# Patient Record
Sex: Male | Born: 1955
Health system: Southern US, Community
[De-identification: ages and names within clinical notes are randomized; demographics above are authoritative.]

## PROBLEM LIST (undated history)

## (undated) DIAGNOSIS — K5120 Ulcerative (chronic) proctitis without complications: Secondary | ICD-10-CM

## (undated) DIAGNOSIS — E785 Hyperlipidemia, unspecified: Secondary | ICD-10-CM

## (undated) HISTORY — DX: Hyperlipidemia, unspecified: E78.5

## (undated) HISTORY — PX: CATARACT EXTRACTION: SUR2

## (undated) HISTORY — DX: Ulcerative (chronic) proctitis without complications: K51.20

## (undated) HISTORY — PX: KNEE SURGERY: SHX244

---

## 2004-10-07 ENCOUNTER — Emergency Department (HOSPITAL_COMMUNITY): Admission: EM | Admit: 2004-10-07 | Discharge: 2004-10-07 | Payer: Self-pay | Admitting: Emergency Medicine

## 2005-05-23 HISTORY — PX: COLONOSCOPY: SHX174

## 2005-12-14 ENCOUNTER — Ambulatory Visit: Payer: Self-pay | Admitting: Internal Medicine

## 2005-12-14 ENCOUNTER — Ambulatory Visit (HOSPITAL_COMMUNITY): Admission: RE | Admit: 2005-12-14 | Discharge: 2005-12-14 | Payer: Self-pay | Admitting: Internal Medicine

## 2012-09-04 ENCOUNTER — Other Ambulatory Visit (INDEPENDENT_AMBULATORY_CARE_PROVIDER_SITE_OTHER): Payer: Managed Care, Other (non HMO)

## 2012-09-04 DIAGNOSIS — E785 Hyperlipidemia, unspecified: Secondary | ICD-10-CM

## 2012-09-04 DIAGNOSIS — I10 Essential (primary) hypertension: Secondary | ICD-10-CM

## 2012-09-04 LAB — COMPREHENSIVE METABOLIC PANEL
ALT: 19 U/L (ref 0–53)
AST: 19 U/L (ref 0–37)
Albumin: 4.4 g/dL (ref 3.5–5.2)
Alkaline Phosphatase: 48 U/L (ref 39–117)
BUN: 19 mg/dL (ref 6–23)
CO2: 30 mEq/L (ref 19–32)
Calcium: 9.4 mg/dL (ref 8.4–10.5)
Chloride: 105 mEq/L (ref 96–112)
Creat: 1 mg/dL (ref 0.50–1.35)
Glucose, Bld: 85 mg/dL (ref 70–99)
Potassium: 4.6 mEq/L (ref 3.5–5.3)
Sodium: 140 mEq/L (ref 135–145)
Total Bilirubin: 0.9 mg/dL (ref 0.3–1.2)
Total Protein: 6.3 g/dL (ref 6.0–8.3)

## 2012-09-04 NOTE — Progress Notes (Signed)
Patient came in for labs only.

## 2012-09-05 LAB — NMR LIPOPROFILE WITH LIPIDS
Cholesterol, Total: 167 mg/dL (ref <200)
HDL Particle Number: 32 umol/L (ref >=30.5)
HDL Size: 8.8 nm — ABNORMAL LOW (ref >=9.2)
HDL-C: 53 mg/dL (ref >=40)
LDL (calc): 105 mg/dL — ABNORMAL HIGH (ref <100)
LDL Particle Number: 1383 nmol/L — ABNORMAL HIGH (ref <1000)
LDL Size: 20.6 nm (ref >20.5)
LP-IR Score: 31 (ref <=45)
Large HDL-P: 5.5 umol/L (ref >=4.8)
Large VLDL-P: <0.8 nmol/L (ref <=2.7)
Small LDL Particle Number: 649 nmol/L — ABNORMAL HIGH (ref <=527)
Triglycerides: 46 mg/dL (ref <150)
VLDL Size: 36.8 nm (ref <=46.6)

## 2012-10-23 ENCOUNTER — Telehealth: Payer: Self-pay | Admitting: Family Medicine

## 2012-10-23 NOTE — Telephone Encounter (Signed)
Patient complains of passing bright red blood in stool x 1 week.  Mainly in the mornings.  He feels the need to have a bowel movement but it's mainly water with blood.  As the day goes on the stool is more formed.  Denies abd pain or rectal pain.  Would like an appointment and possible a GI referral.  Appt scheduled for tomorrow afternoon.

## 2012-10-24 ENCOUNTER — Encounter: Payer: Self-pay | Admitting: Nurse Practitioner

## 2012-10-24 ENCOUNTER — Ambulatory Visit (INDEPENDENT_AMBULATORY_CARE_PROVIDER_SITE_OTHER): Payer: Managed Care, Other (non HMO) | Admitting: Nurse Practitioner

## 2012-10-24 VITALS — BP 105/67 | HR 74 | Temp 98.8°F | Ht 76.0 in | Wt 204.0 lb

## 2012-10-24 DIAGNOSIS — E785 Hyperlipidemia, unspecified: Secondary | ICD-10-CM | POA: Insufficient documentation

## 2012-10-24 DIAGNOSIS — K625 Hemorrhage of anus and rectum: Secondary | ICD-10-CM

## 2012-10-24 DIAGNOSIS — N400 Enlarged prostate without lower urinary tract symptoms: Secondary | ICD-10-CM | POA: Insufficient documentation

## 2012-10-24 LAB — POCT CBC
Granulocyte percent: 53.2 %G (ref 37–80)
HCT, POC: 43 % — AB (ref 43.5–53.7)
Hemoglobin: 15.4 g/dL (ref 14.1–18.1)
Lymph, poc: 2.4 (ref 0.6–3.4)
MCH, POC: 31.6 pg — AB (ref 27–31.2)
MCHC: 35.7 g/dL — AB (ref 31.8–35.4)
MCV: 88.5 fL (ref 80–97)
MPV: 8 fL (ref 0–99.8)
POC Granulocyte: 3.5 (ref 2–6.9)
POC LYMPH PERCENT: 37.3 %L (ref 10–50)
Platelet Count, POC: 192 10*3/uL (ref 142–424)
RBC: 4.9 M/uL (ref 4.69–6.13)
RDW, POC: 12.6 %
WBC: 6.5 10*3/uL (ref 4.6–10.2)

## 2012-10-24 NOTE — Patient Instructions (Addendum)
Rectal Bleeding Rectal bleeding is when blood passes out of the anus. It is usually a sign that something is wrong. It may not be serious, but it should always be evaluated. Rectal bleeding may present as bright red blood or extremely dark stools. The color may range from dark red or maroon to black (like tar). It is important that the cause of rectal bleeding be identified so treatment can be started and the problem corrected. CAUSES   Hemorrhoids. These are enlarged (dilated) blood vessels or veins in the anal or rectal area.  Fistulas. Theseare abnormal, burrowing channels that usually run from inside the rectum to the skin around the anus. They can bleed.  Anal fissures. This is a tear in the tissue of the anus. Bleeding occurs with bowel movements.  Diverticulosis. This is a condition in which pockets or sacs project from the bowel wall. Occasionally, the sacs can bleed.  Diverticulitis. Thisis an infection involving diverticulosis of the colon.  Proctitis and colitis. These are conditions in which the rectum, colon, or both, can become inflamed and pitted (ulcerated).  Polyps and cancer. Polyps are non-cancerous (benign) growths in the colon that may bleed. Certain types of polyps turn into cancer.  Protrusion of the rectum. Part of the rectum can project from the anus and bleed.  Certain medicines.  Intestinal infections.  Blood vessel abnormalities. HOME CARE INSTRUCTIONS  Eat a high-fiber diet to keep your stool soft.  Limit activity.  Drink enough fluids to keep your urine clear or pale yellow.  Warm baths may be useful to soothe rectal pain.  Follow up with your caregiver as directed. SEEK IMMEDIATE MEDICAL CARE IF:  You develop increased bleeding.  You have black or dark red stools.  You vomit blood or material that looks like coffee grounds.  You have abdominal pain or tenderness.  You have a fever.  You feel weak, nauseous, or you faint.  You have  severe rectal pain or you are unable to have a bowel movement. MAKE SURE YOU:  Understand these instructions.  Will watch your condition.  Will get help right away if you are not doing well or get worse. Document Released: 10/29/2001 Document Revised: 08/01/2011 Document Reviewed: 10/24/2010 [facility] Patient Information 2014 [place], [place].

## 2012-10-24 NOTE — Progress Notes (Signed)
  Subjective:    Patient ID: Kyle Sexton, male    DOB: 03-17-1956, 57 y.o.   MRN: [id number]  HPI  Patient in c/o rectal bleeding- Started about 1 week ago while he was on vacation- Bright red blood- Denies an pain or discomfort with bowel movements- Denies constipation.- History of hemorrhoids. Had colonoscopy 6 years ago and had 2 polyps removed.    Review of Systems  All other systems reviewed and are negative.       Objective:   Physical Exam  Constitutional: He appears well-developed and well-nourished.  Cardiovascular: Normal rate and normal heart sounds.   Pulmonary/Chest: Effort normal and breath sounds normal.  Abdominal: Soft. Bowel sounds are normal.  Genitourinary:  Deferred rectal exam to GI    BP 105/67  Pulse 74  Temp(Src) 98.8 F (37.1 C) (Oral)  Ht 6' 4"  (1.93 m)  Wt 204 lb (92.534 kg)  BMI 24.84 kg/m2       Assessment & Plan:  1. Rectal bleeding Increase fiber in diet - Ambulatory referral to Gastroenterology

## 2012-10-29 ENCOUNTER — Telehealth: Payer: Self-pay | Admitting: Nurse Practitioner

## 2012-10-30 ENCOUNTER — Encounter: Payer: Self-pay | Admitting: Gastroenterology

## 2012-10-30 ENCOUNTER — Ambulatory Visit (INDEPENDENT_AMBULATORY_CARE_PROVIDER_SITE_OTHER): Payer: Managed Care, Other (non HMO) | Admitting: Gastroenterology

## 2012-10-30 VITALS — BP 88/53 | HR 65 | Temp 98.6°F | Ht 76.0 in | Wt 207.0 lb

## 2012-10-30 DIAGNOSIS — D369 Benign neoplasm, unspecified site: Secondary | ICD-10-CM | POA: Insufficient documentation

## 2012-10-30 DIAGNOSIS — K625 Hemorrhage of anus and rectum: Secondary | ICD-10-CM | POA: Insufficient documentation

## 2012-10-30 DIAGNOSIS — R195 Other fecal abnormalities: Secondary | ICD-10-CM

## 2012-10-30 MED ORDER — HYDROCORTISONE ACETATE 25 MG RE SUPP: 25.0000 mg | Freq: Two times a day (BID) | RECTAL | Status: DC

## 2012-10-30 MED ORDER — PEG 3350-KCL-NA BICARB-NACL 420 G PO SOLR: 4000.0000 mL | ORAL | Status: DC

## 2012-10-30 NOTE — Patient Instructions (Addendum)
Please complete the stool studies as soon as possible.  I have sent a prescription for anusol suppositories to the pharmacy to take twice a day for 6 days.   We have set you up for a colonoscopy with Dr. Gala Romney as soon as possible.

## 2012-10-30 NOTE — Assessment & Plan Note (Signed)
Stool studies.

## 2012-10-30 NOTE — Assessment & Plan Note (Signed)
Last lower GI evaluation in 2007. TCS as planned.

## 2012-10-30 NOTE — Assessment & Plan Note (Signed)
57 year old male with 3-week-history of painless hematochezia, associated with loose stools several times per day. Otherwise, no complaints and "feels great". Denies exposure to abx or sick contacts. Last colonoscopy in 2007 by Dr. Gala Romney with diminutive rectal polyp and 2X2 cm complex, carpet-like polyp, removed via piecemeal fashion, with remaining tissue ablated with APC, path noting tubular adenoma. He is somewhat overdue for surveillance at this time. Doubt dealing with infectious process, but I have ordered stat stool studies to include Cdiff, cultures, and Giardia. Encouragingly, no anemia on CBC. Needs colonoscopy as soon as possible: will plan on next week. In the interim, Anusol suppositories BID.

## 2012-10-30 NOTE — Progress Notes (Signed)
Referring Provider: Chipper Herb, MD Primary Care Physician:  Redge Gainer, MD Primary Gastroenterologist:  Dr. Gala Romney   Chief Complaint  Patient presents with  . Rectal Bleeding  . Diarrhea    HPI:   [name] is a very pleasant 57 year old male who presents today at the request of Ronnald Collum, NP, secondary to rectal bleeding. He reports 3 weeks of painless, low-volume rectal bleeding. +paper hematochezia. Denies rectal discomfort, abdominal pain, nausea, or vomiting. Denies fever or chills. Only reports occasional gas.  Had diarrhea symptoms in the winter but no bleeding. Every morning for last 3 weeks, has to go straight to bathroom. Watery with blood. An hour later normal BM. Now going 5-6 times per day. States his son has Crohn's disease and is on Humira. Overdue for surveillance colonoscopy. Last lower GI evaluation in 2007 by Dr. Gala Romney: diminutive rectal polyp and 2X2 cm complex, carpet-like polyp, removed via piecemeal fashion, with remaining tissue ablated with APC. fragments of tubular adenoma  Past Medical History  Diagnosis Date  . Hyperlipidemia     Past Surgical History  Procedure Laterality Date  . Knee surgery      both  . Colonoscopy  2007    Dr. Gala Romney: diminutive rectal polyp and 2X2 cm complex, carpet-like polyp, removed via piecemeal fashion, with remaining tissue ablated with APC. fragments of tubular adenoma    Current Outpatient Prescriptions  Medication Sig Dispense Refill  . acetaminophen (TYLENOL) 500 MG tablet Take 500 mg by mouth every 6 (six) hours as needed for pain.      Marland Kitchen ibuprofen (ADVIL,MOTRIN) 200 MG tablet Take 200 mg by mouth every 6 (six) hours as needed for pain.      . hydrocortisone (ANUSOL-HC) 25 MG suppository Place 1 suppository (25 mg total) rectally every 12 (twelve) hours.  12 suppository  1  . polyethylene glycol-electrolytes (TRILYTE) 420 G solution Take 4,000 mLs by mouth as directed.  4000 mL  0   No current  facility-administered medications for this visit.    Allergies as of 10/30/2012 - Review Complete 10/30/2012  Allergen Reaction Noted  . Bee venom Anaphylaxis 10/30/2012    Family History  Problem Relation Age of Onset  . Crohn's disease Son     History   Social History  . Marital Status: Married    Spouse Name: N/A    Number of Children: N/A  . Years of Education: N/A   Occupational History  . Not on file.   Social History Main Topics  . Smoking status: Former Research scientist (life sciences)  . Smokeless tobacco: Current User    Types: Chew  . Alcohol Use: No  . Drug Use: No  . Sexually Active: Not on file   Other Topics Concern  . Not on file   Social History Narrative  . No narrative on file    Review of Systems: Negative unless mentioned in HPI.   Physical Exam: BP 88/53  Pulse 65  Temp(Src) 98.6 F (37 C) (Oral)  Ht 6' 4"  (1.93 m)  Wt 207 lb (93.895 kg)  BMI 25.21 kg/m2 Patient states he stays hypotensive, asymptomatic.  General:   Alert and oriented. Well-developed, well-nourished, pleasant and cooperative. Head:  Normocephalic and atraumatic. Eyes:  Conjunctiva pink, sclera clear, no icterus.   Conjunctiva pink. Ears:  Normal auditory acuity. Nose:  No deformity, discharge,  or lesions. Mouth:  No deformity or lesions, mucosa pink and moist.  Neck:  Supple, without mass or thyromegaly. Lungs:  Clear to auscultation  bilaterally, without wheezing, rales, or rhonchi.  Heart:  S1, S2 present without murmurs noted.  Abdomen:  +BS, soft, non-tender and non-distended. Without mass or HSM. No rebound or guarding. No hernias noted. Rectal:  Deferred  Msk:  Symmetrical without gross deformities. Normal posture. Pulses:  Normal pulses noted. Extremities:  Without clubbing or edema. Neurologic:  Alert and  oriented x4;  grossly normal neurologically. Skin:  Intact, warm and dry without significant lesions or rashes Cervical Nodes:  No significant cervical adenopathy. Psych:   Alert and cooperative. Normal mood and affect.  Lab Results  Component Value Date   WBC 6.5 10/24/2012   HGB 15.4 10/24/2012   HCT 43.0* 10/24/2012   MCV 88.5 10/24/2012

## 2012-10-30 NOTE — Telephone Encounter (Signed)
Error

## 2012-10-31 NOTE — Progress Notes (Signed)
Cc PCP 

## 2012-11-01 ENCOUNTER — Telehealth: Payer: Self-pay | Admitting: Internal Medicine

## 2012-11-01 LAB — GIARDIA ANTIGEN: Giardia Screen (EIA): NEGATIVE

## 2012-11-01 NOTE — Telephone Encounter (Signed)
Looks like cdiff is still pending.

## 2012-11-01 NOTE — Telephone Encounter (Signed)
Pt called this afternoon asking if his stool results were back yet. I told him once they were that the nurse would be calling him. [phone]

## 2012-11-02 ENCOUNTER — Encounter (HOSPITAL_COMMUNITY): Payer: Self-pay | Admitting: Pharmacy Technician

## 2012-11-02 LAB — CLOSTRIDIUM DIFFICILE BY PCR: Toxigenic C. Difficile by PCR: NOT DETECTED

## 2012-11-04 LAB — STOOL CULTURE

## 2012-11-05 NOTE — Progress Notes (Signed)
Quick Note:  Stool studies reviewed.  Cdiff, culture, and Giardia are negative. TCS as planned. ______

## 2012-11-06 ENCOUNTER — Encounter (HOSPITAL_COMMUNITY): Payer: Self-pay | Admitting: *Deleted

## 2012-11-06 ENCOUNTER — Encounter (HOSPITAL_COMMUNITY)
Admission: RE | Disposition: A | Discharge status: Home / Self Care | Payer: Self-pay | Source: Ambulatory Visit | Attending: Internal Medicine

## 2012-11-06 ENCOUNTER — Ambulatory Visit (HOSPITAL_COMMUNITY)
Admission: RE | Admit: 2012-11-06 | Discharge: 2012-11-06 | Disposition: A | Discharge status: Home / Self Care | Payer: Managed Care, Other (non HMO) | Source: Ambulatory Visit | Attending: Internal Medicine | Admitting: Internal Medicine

## 2012-11-06 DIAGNOSIS — K5289 Other specified noninfective gastroenteritis and colitis: Secondary | ICD-10-CM | POA: Insufficient documentation

## 2012-11-06 DIAGNOSIS — K5120 Ulcerative (chronic) proctitis without complications: Secondary | ICD-10-CM

## 2012-11-06 DIAGNOSIS — R197 Diarrhea, unspecified: Secondary | ICD-10-CM | POA: Insufficient documentation

## 2012-11-06 DIAGNOSIS — K921 Melena: Secondary | ICD-10-CM

## 2012-11-06 DIAGNOSIS — Z860100 Personal history of colon polyps, unspecified: Secondary | ICD-10-CM | POA: Insufficient documentation

## 2012-11-06 DIAGNOSIS — D369 Benign neoplasm, unspecified site: Secondary | ICD-10-CM

## 2012-11-06 DIAGNOSIS — K5730 Diverticulosis of large intestine without perforation or abscess without bleeding: Secondary | ICD-10-CM | POA: Insufficient documentation

## 2012-11-06 DIAGNOSIS — E785 Hyperlipidemia, unspecified: Secondary | ICD-10-CM | POA: Insufficient documentation

## 2012-11-06 DIAGNOSIS — R195 Other fecal abnormalities: Secondary | ICD-10-CM

## 2012-11-06 DIAGNOSIS — K625 Hemorrhage of anus and rectum: Secondary | ICD-10-CM

## 2012-11-06 HISTORY — PX: COLONOSCOPY: SHX5424

## 2012-11-06 SURGERY — COLONOSCOPY
Anesthesia: Moderate Sedation

## 2012-11-06 MED ORDER — MEPERIDINE HCL 100 MG/ML IJ SOLN
INTRAMUSCULAR | Status: AC
  Filled 2012-11-06: qty 2

## 2012-11-06 MED ORDER — SODIUM CHLORIDE 0.9 % IV SOLN
INTRAVENOUS | Status: DC
  Administered 2012-11-06: 11:00:00 via INTRAVENOUS

## 2012-11-06 MED ORDER — MEPERIDINE HCL 100 MG/ML IJ SOLN
INTRAMUSCULAR | Status: DC | PRN
  Administered 2012-11-06: 25 mg via INTRAVENOUS
  Administered 2012-11-06 (×2): 50 mg via INTRAVENOUS

## 2012-11-06 MED ORDER — MIDAZOLAM HCL 5 MG/5ML IJ SOLN
INTRAMUSCULAR | Status: AC
  Filled 2012-11-06: qty 10

## 2012-11-06 MED ORDER — ONDANSETRON HCL 4 MG/2ML IJ SOLN
INTRAMUSCULAR | Status: DC | PRN
  Administered 2012-11-06: 4 mg via INTRAVENOUS

## 2012-11-06 MED ORDER — STERILE WATER FOR IRRIGATION IR SOLN
Status: DC | PRN
  Administered 2012-11-06: 12:00:00

## 2012-11-06 MED ORDER — MIDAZOLAM HCL 5 MG/5ML IJ SOLN
INTRAMUSCULAR | Status: DC | PRN
  Administered 2012-11-06: 2 mg via INTRAVENOUS
  Administered 2012-11-06 (×2): 1 mg via INTRAVENOUS
  Administered 2012-11-06: 2 mg via INTRAVENOUS

## 2012-11-06 MED ORDER — ONDANSETRON HCL 4 MG/2ML IJ SOLN
INTRAMUSCULAR | Status: AC
  Filled 2012-11-06: qty 2

## 2012-11-06 NOTE — Op Note (Signed)
[facility] [address] [place], [address]   COLONOSCOPY PROCEDURE REPORT  PATIENT: Kyle Sexton, Kyle Sexton  MR#:         [id number] BIRTHDATE: July 03, 1955 , 38  yrs. old GENDER: Male ENDOSCOPIST: R.  Garfield Cornea, MD FACP FACG REFERRED BY:  Redge Gainer, M.D. PROCEDURE DATE:  11/06/2012 PROCEDURE:     Ileo-colonoscopy with segmental biopsy  INDICATIONS: Bloody diarrhea; History of colonic adenoma  INFORMED CONSENT:  The risks, benefits, alternatives and imponderables including but not limited to bleeding, perforation as well as the possibility of a missed lesion have been reviewed.  The potential for biopsy, lesion removal, etc. have also been discussed.  Questions have been answered.  All parties agreeable. Please see the history and physical in the medical record for more information.  MEDICATIONS: Versed 6 mg IV and Demerol 125 mg IV in divided doses. Zofran 4 mg IV  DESCRIPTION OF PROCEDURE:  After a digital rectal exam was performed, the EC-[id number] ([id number])  colonoscope was advanced from the anus through the rectum and colon to the area of the cecum, ileocecal valve and appendiceal orifice.  The cecum was deeply intubated.  These structures were well-seen and photographed for the record.  From the level of the cecum and ileocecal valve, the scope was slowly and cautiously withdrawn.  The mucosal surfaces were carefully surveyed utilizing scope tip deflection to facilitate fold flattening as needed.  The scope was pulled down into the rectum where a thorough examination including retroflexion was performed.    FINDINGS:  Adequate preparation. Diffusely abnormal rectum with granularity friability and loss of the normal vascular pattern. No ulcerations. Inflammatory changes in the rectum extending confluently up to the junction of the sigmoid and descending segment; proximal to the sigmoid segment, the remainder of the colonic mucosa appeared normal except  for pancolonic diverticula; the distal 10 cm of terminal ileal mucosa appeared normal.  THERAPEUTIC / DIAGNOSTIC MANEUVERS PERFORMED:  The abnormal rectum and sigmoid were biopsied for histologic study. Likewise, biopsies of the normal-appearing descending and transverse segments were also biopsied segmentally.  COMPLICATIONS: None  CECAL WITHDRAWAL TIME:  10 minutes  IMPRESSION:  Left sided proctocolitis. Colonic diverticulosis  RECOMMENDATIONS:  I anticipate initiating therapy for ulcerative colitis in the near future pending review of pathology report. I recommend minimizing use of nonsteroidal agents. Further recommendations to follow.   _______________________________ eSigned:  R. Garfield Cornea, MD FACP [facility] 11/06/2012 1:07 PM   CC:    PATIENT NAME:  [name], [name] MR#: [id number]

## 2012-11-06 NOTE — H&P (View-Only) (Signed)
Referring Provider: Chipper Herb, MD Primary Care Physician:  Redge Gainer, MD Primary Gastroenterologist:  Dr. Gala Romney   Chief Complaint  Patient presents with  . Rectal Bleeding  . Diarrhea    HPI:   [name] is a very pleasant 57 year old male who presents today at the request of Ronnald Collum, NP, secondary to rectal bleeding. He reports 3 weeks of painless, low-volume rectal bleeding. +paper hematochezia. Denies rectal discomfort, abdominal pain, nausea, or vomiting. Denies fever or chills. Only reports occasional gas.  Had diarrhea symptoms in the winter but no bleeding. Every morning for last 3 weeks, has to go straight to bathroom. Watery with blood. An hour later normal BM. Now going 5-6 times per day. States his son has Crohn's disease and is on Humira. Overdue for surveillance colonoscopy. Last lower GI evaluation in 2007 by Dr. Gala Romney: diminutive rectal polyp and 2X2 cm complex, carpet-like polyp, removed via piecemeal fashion, with remaining tissue ablated with APC. fragments of tubular adenoma  Past Medical History  Diagnosis Date  . Hyperlipidemia     Past Surgical History  Procedure Laterality Date  . Knee surgery      both  . Colonoscopy  2007    Dr. Gala Romney: diminutive rectal polyp and 2X2 cm complex, carpet-like polyp, removed via piecemeal fashion, with remaining tissue ablated with APC. fragments of tubular adenoma    Current Outpatient Prescriptions  Medication Sig Dispense Refill  . acetaminophen (TYLENOL) 500 MG tablet Take 500 mg by mouth every 6 (six) hours as needed for pain.      Marland Kitchen ibuprofen (ADVIL,MOTRIN) 200 MG tablet Take 200 mg by mouth every 6 (six) hours as needed for pain.      . hydrocortisone (ANUSOL-HC) 25 MG suppository Place 1 suppository (25 mg total) rectally every 12 (twelve) hours.  12 suppository  1  . polyethylene glycol-electrolytes (TRILYTE) 420 G solution Take 4,000 mLs by mouth as directed.  4000 mL  0   No current  facility-administered medications for this visit.    Allergies as of 10/30/2012 - Review Complete 10/30/2012  Allergen Reaction Noted  . Bee venom Anaphylaxis 10/30/2012    Family History  Problem Relation Age of Onset  . Crohn's disease Son     History   Social History  . Marital Status: Married    Spouse Name: N/A    Number of Children: N/A  . Years of Education: N/A   Occupational History  . Not on file.   Social History Main Topics  . Smoking status: Former Research scientist (life sciences)  . Smokeless tobacco: Current User    Types: Chew  . Alcohol Use: No  . Drug Use: No  . Sexually Active: Not on file   Other Topics Concern  . Not on file   Social History Narrative  . No narrative on file    Review of Systems: Negative unless mentioned in HPI.   Physical Exam: BP 88/53  Pulse 65  Temp(Src) 98.6 F (37 C) (Oral)  Ht 6' 4"  (1.93 m)  Wt 207 lb (93.895 kg)  BMI 25.21 kg/m2 Patient states he stays hypotensive, asymptomatic.  General:   Alert and oriented. Well-developed, well-nourished, pleasant and cooperative. Head:  Normocephalic and atraumatic. Eyes:  Conjunctiva pink, sclera clear, no icterus.   Conjunctiva pink. Ears:  Normal auditory acuity. Nose:  No deformity, discharge,  or lesions. Mouth:  No deformity or lesions, mucosa pink and moist.  Neck:  Supple, without mass or thyromegaly. Lungs:  Clear to auscultation  bilaterally, without wheezing, rales, or rhonchi.  Heart:  S1, S2 present without murmurs noted.  Abdomen:  +BS, soft, non-tender and non-distended. Without mass or HSM. No rebound or guarding. No hernias noted. Rectal:  Deferred  Msk:  Symmetrical without gross deformities. Normal posture. Pulses:  Normal pulses noted. Extremities:  Without clubbing or edema. Neurologic:  Alert and  oriented x4;  grossly normal neurologically. Skin:  Intact, warm and dry without significant lesions or rashes Cervical Nodes:  No significant cervical adenopathy. Psych:   Alert and cooperative. Normal mood and affect.  Lab Results  Component Value Date   WBC 6.5 10/24/2012   HGB 15.4 10/24/2012   HCT 43.0* 10/24/2012   MCV 88.5 10/24/2012

## 2012-11-06 NOTE — Interval H&P Note (Signed)
   History and Physical Interval Note:  11/06/2012 12:12 PM  Kyle Sexton  has presented today for surgery, with the diagnosis of RECTAL BLEEDING AND ADENOMATOUS POLYPS  The various methods of treatment have been discussed with the patient and family. After consideration of risks, benefits and other options for treatment, the patient has consented to  Procedure(s) with comments: COLONOSCOPY (N/A) - 12:00 as a surgical intervention .  The patient's history has been reviewed, patient examined, no change in status, stable for surgery.  I have reviewed the patient's chart and labs.  Questions were answered to the patient's satisfaction.     Robert Rourk  Stool studies negative. Colonoscopy per plan.The risks, benefits, limitations, alternatives and imponderables have been reviewed with the patient. Questions have been answered. All parties are agreeable.

## 2012-11-07 ENCOUNTER — Telehealth: Payer: Self-pay | Admitting: Internal Medicine

## 2012-11-07 NOTE — Telephone Encounter (Signed)
Pt has reinterated exactly what he was told yesterday.  Once biopsies back (confirming UC) will likely start him on a comb of hydrocortisone enemas and colazol (cre normal 4/14). Should be wn next 48 hrs

## 2012-11-07 NOTE — Telephone Encounter (Signed)
Path report is not back yet. Routing FYI to RMR.

## 2012-11-07 NOTE — Telephone Encounter (Signed)
Pt called this afternoon to say he had his TCS done yesterday by RMR and he was told the he would know RMR's recommendations by the end of the week because patient will be working out of town next week and can not by reached. He had concerns about if RMR was going to put him on any medications before he leaves. Please advise and call [phone]

## 2012-11-08 ENCOUNTER — Encounter (HOSPITAL_COMMUNITY): Payer: Self-pay | Admitting: Internal Medicine

## 2012-11-08 MED ORDER — HYDROCORTISONE 100 MG/60ML RE ENEM: 100.0000 mg | ENEMA | Freq: Every day | RECTAL | Status: DC

## 2012-11-08 MED ORDER — BALSALAZIDE DISODIUM 750 MG PO CAPS: 2250.0000 mg | ORAL_CAPSULE | Freq: Three times a day (TID) | ORAL | Status: DC

## 2012-11-08 NOTE — Telephone Encounter (Signed)
Pt is aware of results from path and rx's per RMR have been sent to the pharmacy.

## 2012-11-19 ENCOUNTER — Encounter: Payer: Self-pay | Admitting: Internal Medicine

## 2012-12-13 ENCOUNTER — Other Ambulatory Visit: Payer: Self-pay

## 2012-12-13 ENCOUNTER — Telehealth: Payer: Self-pay | Admitting: Internal Medicine

## 2012-12-13 ENCOUNTER — Other Ambulatory Visit: Payer: Self-pay | Admitting: Internal Medicine

## 2012-12-13 DIAGNOSIS — K625 Hemorrhage of anus and rectum: Secondary | ICD-10-CM

## 2012-12-13 NOTE — Telephone Encounter (Signed)
Please call patient at [phone] regarding his medications. He has concerns and also has OV on 8/7

## 2012-12-13 NOTE — Telephone Encounter (Signed)
Patient has a concern that he has had some rectal bleeding for the last 3-4 days.  He was wondering if he should start back on the medical regiment he was on back in June 2014.  Patient also denies any other problems such as abd pain Please advise?

## 2012-12-13 NOTE — Telephone Encounter (Signed)
C-

## 2012-12-13 NOTE — Telephone Encounter (Signed)
Lab order faxed to the lab.

## 2012-12-13 NOTE — Telephone Encounter (Signed)
Patient has multiple questions. Sounds like he needs an expedited office visit with a CBC just before the visit. Please set him up for the extender

## 2012-12-13 NOTE — Telephone Encounter (Signed)
Per the patient he will have lab work done on tomorrow and he is working out of town all next week, so he will keep his 8/7 Kyle Sexton, will fax labs

## 2012-12-14 ENCOUNTER — Other Ambulatory Visit (INDEPENDENT_AMBULATORY_CARE_PROVIDER_SITE_OTHER): Payer: Managed Care, Other (non HMO)

## 2012-12-14 DIAGNOSIS — K625 Hemorrhage of anus and rectum: Secondary | ICD-10-CM

## 2012-12-14 LAB — POCT CBC
Granulocyte percent: 59.7 %G (ref 37–80)
HCT, POC: 44.8 % (ref 43.5–53.7)
Hemoglobin: 15.1 g/dL (ref 14.1–18.1)
Lymph, poc: 1.9 (ref 0.6–3.4)
MCH, POC: 30.2 pg (ref 27–31.2)
MCHC: 33.7 g/dL (ref 31.8–35.4)
MCV: 89.6 fL (ref 80–97)
MPV: 8.2 fL (ref 0–99.8)
POC Granulocyte: 3.5 (ref 2–6.9)
POC LYMPH PERCENT: 32.7 %L (ref 10–50)
Platelet Count, POC: 175 10*3/uL (ref 142–424)
RBC: 5 M/uL (ref 4.69–6.13)
RDW, POC: 12.8 %
WBC: 5.9 10*3/uL (ref 4.6–10.2)

## 2012-12-14 NOTE — Progress Notes (Signed)
Pt came in for labs from dr. Sydell Axon in notes under callls

## 2012-12-17 ENCOUNTER — Ambulatory Visit: Payer: Managed Care, Other (non HMO) | Admitting: Gastroenterology

## 2012-12-18 ENCOUNTER — Encounter: Payer: Self-pay | Admitting: Internal Medicine

## 2012-12-19 ENCOUNTER — Encounter: Payer: Self-pay | Admitting: Internal Medicine

## 2012-12-21 ENCOUNTER — Other Ambulatory Visit: Payer: Self-pay

## 2012-12-21 MED ORDER — HYDROCORTISONE 100 MG/60ML RE ENEM: 100.0000 mg | ENEMA | Freq: Every day | RECTAL | Status: DC

## 2012-12-21 NOTE — Progress Notes (Signed)
Quick Note:  Pt aware, rx sent to pharmacy. ______

## 2012-12-26 ENCOUNTER — Ambulatory Visit (INDEPENDENT_AMBULATORY_CARE_PROVIDER_SITE_OTHER): Payer: Managed Care, Other (non HMO) | Admitting: Nurse Practitioner

## 2012-12-26 DIAGNOSIS — T63461A Toxic effect of venom of wasps, accidental (unintentional), initial encounter: Secondary | ICD-10-CM

## 2012-12-26 DIAGNOSIS — T6391XA Toxic effect of contact with unspecified venomous animal, accidental (unintentional), initial encounter: Secondary | ICD-10-CM

## 2012-12-26 MED ORDER — EPINEPHRINE 0.3 MG/0.3ML IJ SOAJ: 0.3000 mg | Freq: Once | INTRAMUSCULAR | Status: DC

## 2012-12-26 MED ORDER — EPINEPHRINE HCL 1 MG/ML IJ SOLN
0.3000 mL | Freq: Once | INTRAMUSCULAR | Status: AC
  Administered 2012-12-26: 0.3 mg via INTRAMUSCULAR

## 2012-12-26 NOTE — Progress Notes (Signed)
  Subjective:    Patient ID: Kyle Sexton, male    DOB: May 28, 1955, 57 y.o.   MRN: [id number]  HPI Patient was mowing his yard and got into a yellow jackets nest and he is allergic to bee stings- Michela Pitcher he got stung 7-8 times mainly on lower ext- Feels a little short of breath and airway feels a little tight.    Review of Systems  All other systems reviewed and are negative.       Objective:   Physical Exam  Constitutional: He appears well-developed.  No acute distress  Cardiovascular: Normal rate, regular rhythm and normal heart sounds.   Pulmonary/Chest: Effort normal and breath sounds normal.  Skin:  Multiple erythematous whelps on bil lower ext.    BP 103/62  Pulse 74  Temp(Src) 97.4 F (36.3 C) (Oral)  Ht 6' 4"  (1.93 m)  Wt 212 lb (96.163 kg)  BMI 25.82 kg/m2       Assessment & Plan:  1. Bee sting reaction, initial encounter Discussed epipen injector Avoid bee stings - EPINEPHrine (ADRENALIN) injection 0.3 mg; Inject 0.3 mLs (0.3 mg total) into the muscle once. - EPINEPHrine (EPIPEN) 0.3 mg/0.3 mL SOAJ injection; Inject 0.3 mLs (0.3 mg total) into the muscle once.  Dispense: 2 Device; Refill: [place], FNP

## 2012-12-26 NOTE — Patient Instructions (Signed)
Epinephrine injection (Auto-injector) What is this medicine? EPINEPHRINE (ep i NEF rin) is used for the emergency treatment of severe allergic reactions. You should keep this medicine with you at all times. This medicine may be used for other purposes; ask your health care provider or pharmacist if you have questions. What should I tell my health care provider before I take this medicine? They need to know if you have any of the following conditions: -an unusual or allergic reaction to epinephrine, sulfites, other medicines, foods, dyes, or preservatives -pregnant or trying to get pregnant -breast-feeding How should I use this medicine? This medicine is for injection into the outer thigh. Your doctor or health care professional will instruct you on the proper use of the device during an emergency. Read all directions carefully and make sure you understand them. Do not use more often than directed. Talk to your pediatrician regarding the use of this medicine in children. Special care may be needed. This drug is commonly used in children. A special device is available for use in children. Overdosage: If you think you have taken too much of this medicine contact a poison control center or emergency room at once. NOTE: This medicine is only for you. Do not share this medicine with others. What if I miss a dose? This does not apply. You should only use this medicine for an allergic reaction. What may interact with this medicine? This medicine is only used during an emergency. Significant drug interactions are not likely during emergency use. This list may not describe all possible interactions. Give your health care provider a list of all the medicines, herbs, non-prescription drugs, or dietary supplements you use. Also tell them if you smoke, drink alcohol, or use illegal drugs. Some items may interact with your medicine. What should I watch for while using this medicine? Keep this medicine ready for  use in the case of a severe allergic reaction. Make sure that you have the phone number of your doctor or health care professional and local hospital ready. Remember to check the expiration date of your medicine regularly. You may need to have additional units of this medicine with you at work, school, or other places. Talk to your doctor or health care professional about your need for extra units. Some emergencies may require an additional dose. Check with your doctor or a health care professional before using an extra dose. After use, go to the nearest hospital or call 911. Avoid physical activity. Make sure the treating health care professional knows you have received an injection of this medicine. You will receive additional instructions on what to do during and after use of this medicine before a medical emergency occurs. What side effects may I notice from receiving this medicine? Side effects that you should report to your doctor or health care professional as soon as possible: -allergic reactions like skin rash, itching or hives, swelling of the face, lips, or tongue -breathing problems -chest pain -flushing -irregular or pounding heartbeat -numbness in fingers or toes -vomiting Side effects that usually do not require medical attention (report to your doctor or health care professional if they continue or are bothersome): -anxiety or nervousness -dizzy, drowsy -dry mouth -headache -increased sweating -nausea -tired, weak This list may not describe all possible side effects. Call your doctor for medical advice about side effects. You may report side effects to FDA at 1-800-FDA-1088. Where should I keep my medicine? Keep out of the reach of children. Store at room temperature between  15 and 30 degrees C (59 and 86 degrees F). Protect from light and heat. The solution should be clear in color. If the solution is discolored or contains particles it must be replaced. Throw away any unused  medicine after the expiration date. Ask your doctor or pharmacist about proper disposal of the injector if it is expired or has been used. Always replace your auto-injector before it expires. NOTE: This sheet is a summary. It may not cover all possible information. If you have questions about this medicine, talk to your doctor, pharmacist, or health care provider.  2013, [employer]. (09/11/2007 4:32:55 PM)

## 2012-12-27 ENCOUNTER — Encounter: Payer: Self-pay | Admitting: Gastroenterology

## 2012-12-27 ENCOUNTER — Ambulatory Visit (INDEPENDENT_AMBULATORY_CARE_PROVIDER_SITE_OTHER): Payer: Managed Care, Other (non HMO) | Admitting: Gastroenterology

## 2012-12-27 VITALS — BP 98/62 | HR 63 | Temp 97.5°F | Ht 76.0 in | Wt 212.0 lb

## 2012-12-27 DIAGNOSIS — K51211 Ulcerative (chronic) proctitis with rectal bleeding: Secondary | ICD-10-CM

## 2012-12-27 DIAGNOSIS — K5120 Ulcerative (chronic) proctitis without complications: Secondary | ICD-10-CM | POA: Insufficient documentation

## 2012-12-27 DIAGNOSIS — K5130 Ulcerative (chronic) rectosigmoiditis without complications: Secondary | ICD-10-CM

## 2012-12-27 MED ORDER — BALSALAZIDE DISODIUM 750 MG PO CAPS: 2250.0000 mg | ORAL_CAPSULE | Freq: Three times a day (TID) | ORAL | Status: DC

## 2012-12-27 NOTE — Assessment & Plan Note (Addendum)
57 year old male recently diagnosed with left-sided proctocolitis, responding well to initial therapy of Colazal 2.25 grams total daily and short course of cortenemas. However, he noted recurrence of symptoms, calling into our office, with an additional short course of cortenemas provided. On further discussion, appears he only took Colazal for one month and did not realize this was needed daily.   Discussed resuming Colazal. Prescription sent to pharmacy.  Discussed obtaining baseline DEXA scan in future; but this is not urgent.  Informed of need for flu and pneumonia vaccinations: see PCP 3 month follow-up

## 2012-12-27 NOTE — Progress Notes (Signed)
Referring Provider: Chipper Herb, MD Primary Care Physician:  Redge Gainer, MD  Chief Complaint  Patient presents with  . Follow-up    still having some rectal bleeding    HPI:   57 year old male presents today in follow-up after colonoscopy. Found to have left-sided proctocolitis and started on cortenemas 100 mg rectal nightly X 3 weeks. Colazal 750 mg tablets 3 tablets by mouth TID prescribed.   A week ago started bleeding again. Prescribed additional week of cortenemas. Mostly paper hematochezia. Has normal BM in the morning and sometimes has the urge for BM. If he does, will be water and small amount of blood. No NSAIDs. He only took one month supply of Colazal, as he thought this was all that was needed.     Past Medical History  Diagnosis Date  . Hyperlipidemia   . Ulcerative proctocolitis without complication     Past Surgical History  Procedure Laterality Date  . Knee surgery      both  . Colonoscopy  2007    Dr. Gala Romney: diminutive rectal polyp and 2X2 cm complex, carpet-like polyp, removed via piecemeal fashion, with remaining tissue ablated with APC. fragments of tubular adenoma  . Colonoscopy N/A 11/06/2012    GXQ:JJHE sided proctocolitis. Colonic diverticulosis    Current Outpatient Prescriptions  Medication Sig Dispense Refill  . acetaminophen (TYLENOL) 500 MG tablet Take 500 mg by mouth every 6 (six) hours as needed for pain.      Marland Kitchen COLOCORT 100 MG/60ML enema       . EPINEPHrine (EPIPEN) 0.3 mg/0.3 mL SOAJ injection Inject 0.3 mLs (0.3 mg total) into the muscle once.  2 Device  11  . balsalazide (COLAZAL) 750 MG capsule Take 3 capsules (2,250 mg total) by mouth 3 (three) times daily.  270 capsule  11   No current facility-administered medications for this visit.    Allergies as of 12/27/2012 - Review Complete 12/27/2012  Allergen Reaction Noted  . Bee venom Anaphylaxis 10/30/2012    Family History  Problem Relation Age of Onset  . Crohn's disease Son      History   Social History  . Marital Status: Married    Spouse Name: N/A    Number of Children: N/A  . Years of Education: N/A   Social History Main Topics  . Smoking status: Former Research scientist (life sciences)  . Smokeless tobacco: Current User    Types: Chew  . Alcohol Use: No  . Drug Use: No  . Sexually Active: None   Other Topics Concern  . None   Social History Narrative  . None    Review of Systems: Negative unless mentioned in HPI.   Physical Exam: BP 98/62  Pulse 63  Temp(Src) 97.5 F (36.4 C) (Oral)  Ht 6' 4"  (1.93 m)  Wt 212 lb (96.163 kg)  BMI 25.82 kg/m2 General:   Alert and oriented. No distress noted. Pleasant and cooperative.  Head:  Normocephalic and atraumatic. Eyes:  Conjuctiva clear without scleral icterus. Heart:  S1, S2 present without murmurs, rubs, or gallops. Regular rate and rhythm. Abdomen:  +BS, soft, non-tender and non-distended. No rebound or guarding. No HSM or masses noted. Msk:  Symmetrical without gross deformities. Normal posture. Extremities:  Without edema. Neurologic:  Alert and  oriented x4;  grossly normal neurologically. Skin:  Intact without significant lesions or rashes. Psych:  Alert and cooperative. Normal mood and affect.  Lab Results  Component Value Date   WBC 5.9 12/14/2012   HGB 15.1 12/14/2012  HCT 44.8 12/14/2012   MCV 89.6 12/14/2012

## 2012-12-27 NOTE — Patient Instructions (Addendum)
Start taking Colazal 3 capsules three times a day (total of 9 pills). You will need to continue taking this daily.   We will see you back in 3 months!  Please contact us if your symptoms persist or worsen.

## 2012-12-28 ENCOUNTER — Encounter: Payer: Self-pay | Admitting: Gastroenterology

## 2012-12-31 NOTE — Progress Notes (Signed)
CC'd to PCP 

## 2013-03-05 ENCOUNTER — Telehealth: Payer: Self-pay | Admitting: *Deleted

## 2013-03-05 NOTE — Telephone Encounter (Signed)
Per RMR- pt needs to stop the medication and have an ov this week. He is on the schedule for Thursday at 2:30. Pt is aware.

## 2013-03-05 NOTE — Telephone Encounter (Signed)
No pain just bleeding from the rectum. Started three days ago not much just when he has BM. He thinks the medication is not working now and would like to know if we could give him something different.

## 2013-03-05 NOTE — Telephone Encounter (Signed)
Pt states he is on RX ealsalazide, pt states he has been on medication for 3 months and is having bleeding. Please advise [phone].

## 2013-03-07 ENCOUNTER — Encounter (INDEPENDENT_AMBULATORY_CARE_PROVIDER_SITE_OTHER): Payer: Self-pay

## 2013-03-07 ENCOUNTER — Ambulatory Visit (INDEPENDENT_AMBULATORY_CARE_PROVIDER_SITE_OTHER): Payer: Managed Care, Other (non HMO) | Admitting: Gastroenterology

## 2013-03-07 ENCOUNTER — Encounter: Payer: Self-pay | Admitting: Gastroenterology

## 2013-03-07 VITALS — BP 98/60 | HR 74 | Temp 98.5°F | Ht 76.0 in | Wt 211.6 lb

## 2013-03-07 DIAGNOSIS — K529 Noninfective gastroenteritis and colitis, unspecified: Secondary | ICD-10-CM

## 2013-03-07 DIAGNOSIS — K5120 Ulcerative (chronic) proctitis without complications: Secondary | ICD-10-CM

## 2013-03-07 DIAGNOSIS — K625 Hemorrhage of anus and rectum: Secondary | ICD-10-CM

## 2013-03-07 MED ORDER — MESALAMINE 400 MG PO CPDR: 800.0000 mg | DELAYED_RELEASE_CAPSULE | Freq: Three times a day (TID) | ORAL | Status: DC

## 2013-03-07 NOTE — Assessment & Plan Note (Addendum)
Flare of proctocolitis with rectal bleeding. Had been doing well for couple of months up until recently. No other symptoms. He requests switching maintenance medications therefore we will try Delzicol 85m TID. Call with persistent problems. OV with Dr. RGala Romneyin six months.

## 2013-03-07 NOTE — Progress Notes (Signed)
Primary Care Physician: Redge Gainer, MD  Primary Gastroenterologist:  Garfield Cornea, MD   Chief Complaint  Patient presents with  . Rectal Bleeding    HPI: Kyle Sexton is a 57 y.o. male here for further evaluation of recurrent rectal bleeding. He was diagnosed with left-sided proctocolitis back in 10/2012. Has been on Colazal as prescribed. Denies rectal pain/abdominal pain. No diarrhea. Appetite good. Recently started having rectal bleeding again and wonders if his medication is not working like it should. Denies any recent medication changes.   Current Outpatient Prescriptions  Medication Sig Dispense Refill  . acetaminophen (TYLENOL) 500 MG tablet Take 500 mg by mouth every 6 (six) hours as needed for pain.      Marland Kitchen EPINEPHrine (EPIPEN) 0.3 mg/0.3 mL SOAJ injection Inject 0.3 mLs (0.3 mg total) into the muscle once.  2 Device  11  . balsalazide (COLAZAL) 750 MG capsule Take 3 capsules (2,250 mg total) by mouth 3 (three) times daily.  270 capsule  11  . COLOCORT 100 MG/60ML enema        No current facility-administered medications for this visit.    Allergies as of 03/07/2013 - Review Complete 03/07/2013  Allergen Reaction Noted  . Bee venom Anaphylaxis 10/30/2012    ROS:  General: Negative for anorexia, weight loss, fever, chills, fatigue, weakness. ENT: Negative for hoarseness, difficulty swallowing , nasal congestion. CV: Negative for chest pain, angina, palpitations, dyspnea on exertion, peripheral edema.  Respiratory: Negative for dyspnea at rest, dyspnea on exertion, cough, sputum, wheezing.  GI: See history of present illness. GU:  Negative for dysuria, hematuria, urinary incontinence, urinary frequency, nocturnal urination.  Endo: Negative for unusual weight change.    Physical Examination:   BP 98/60  Pulse 74  Temp(Src) 98.5 F (36.9 C) (Oral)  Ht 6' 4"  (1.93 m)  Wt 211 lb 9.6 oz (95.981 kg)  BMI 25.77 kg/m2  General: Well-nourished, well-developed in no  acute distress.  Eyes: No icterus. Mouth: Oropharyngeal mucosa moist and pink , no lesions erythema or exudate. Lungs: Clear to auscultation bilaterally.  Heart: Regular rate and rhythm, no murmurs rubs or gallops.  Abdomen: Bowel sounds are normal, nontender, nondistended, no hepatosplenomegaly or masses, no abdominal bruits or hernia , no rebound or guarding.   Extremities: No lower extremity edema. No clubbing or deformities. Neuro: Alert and oriented x 4   Skin: Warm and dry, no jaundice.   Psych: Alert and cooperative, normal mood and affect.

## 2013-03-07 NOTE — Patient Instructions (Signed)
1. Delzicol 849m three times per day. Samples provided/rebate card. If your insurance company does not cover this medication, we will try to get it approved. 2. Call if persistent bleeding.

## 2013-03-11 ENCOUNTER — Ambulatory Visit: Payer: Managed Care, Other (non HMO) | Admitting: Gastroenterology

## 2013-03-11 NOTE — Progress Notes (Signed)
cc'd to pcp 

## 2013-03-13 ENCOUNTER — Telehealth: Payer: Self-pay | Admitting: Internal Medicine

## 2013-03-13 NOTE — Telephone Encounter (Signed)
He needs a course of Rowasa enemas  one per rectum at bedtime times one month in addition to Delizol; make sure he is taking no nonsteroidals.  Please call in prescription for 30 with no refills.  Telephone Report in 2 weeks.

## 2013-03-13 NOTE — Telephone Encounter (Signed)
Pt was given Delzicol 442m on 03/07/13 and he states he is still having a lot of bleeding with bm's, no pain or discomfort. Having 1-2 bm's daily and they are normal. No N/V.  Please advise.

## 2013-03-13 NOTE — Telephone Encounter (Signed)
Pt called this afternoon. He said he has been taking the medicine we prescribe him (samples) and they aren't working for him. He said he has colitis and it's been 6 days and medicines aren't working for him. Please advise and call him ASAP as he requested at [phone]

## 2013-03-14 ENCOUNTER — Ambulatory Visit: Payer: Managed Care, Other (non HMO) | Admitting: Gastroenterology

## 2013-03-14 MED ORDER — MESALAMINE 4 G RE ENEM: 4.0000 g | ENEMA | Freq: Every day | RECTAL | Status: DC

## 2013-03-14 NOTE — Telephone Encounter (Signed)
Pt is aware. rx sent to the pharmacy.

## 2013-03-14 NOTE — Addendum Note (Signed)
Addended by: Claudina Lick on: 03/14/2013 11:14 AM   Modules accepted: Orders

## 2013-03-14 NOTE — Telephone Encounter (Signed)
Tried to call pt- LMOM 

## 2013-03-20 ENCOUNTER — Ambulatory Visit: Payer: Managed Care, Other (non HMO) | Admitting: Gastroenterology

## 2013-03-28 ENCOUNTER — Ambulatory Visit: Payer: Managed Care, Other (non HMO) | Admitting: Gastroenterology

## 2013-03-28 ENCOUNTER — Encounter: Payer: Self-pay | Admitting: Gastroenterology

## 2013-03-28 ENCOUNTER — Ambulatory Visit (INDEPENDENT_AMBULATORY_CARE_PROVIDER_SITE_OTHER): Payer: Managed Care, Other (non HMO) | Admitting: Gastroenterology

## 2013-03-28 ENCOUNTER — Telehealth: Payer: Self-pay

## 2013-03-28 VITALS — BP 104/65 | HR 83 | Temp 97.4°F | Wt 219.8 lb

## 2013-03-28 DIAGNOSIS — K529 Noninfective gastroenteritis and colitis, unspecified: Secondary | ICD-10-CM

## 2013-03-28 DIAGNOSIS — K5120 Ulcerative (chronic) proctitis without complications: Secondary | ICD-10-CM

## 2013-03-28 MED ORDER — BUDESONIDE 9 MG PO TB24: 9.0000 mg | ORAL_TABLET | Freq: Every day | ORAL | Status: DC

## 2013-03-28 NOTE — Patient Instructions (Signed)
1. Continue Delzicol. 2. Add Uceris 6m daily for 8 weeks.  3. Office visit in 8 weeks. If you are out of town, please call so we can give you taper instructions for Uceris. 4. Call within next week if you show no signs of improvement.

## 2013-03-28 NOTE — Telephone Encounter (Signed)
Pt called- he has been taking Delzicol and rowasa enemas and he stated the bleeding is getting worse. First thing in the AM he goes to the bathroom and has a BM that is a little loose and full of blood. Later in the day he will have a second BM that is normal but still has a lot of blood in it. Pt also reports a lot of gas. Pt stated he has no abd pain, no N/V, no fever.   Pt wants to know what he should do now.

## 2013-03-28 NOTE — Telephone Encounter (Signed)
Pt coming in for ov today

## 2013-03-28 NOTE — Telephone Encounter (Signed)
Will need to discuss next step in management soon

## 2013-03-28 NOTE — Progress Notes (Signed)
Primary Care Physician: Redge Gainer, MD  Primary Gastroenterologist:  Garfield Cornea, MD   Chief Complaint  Patient presents with  . Rectal Bleeding    HPI: Kyle Sexton is a 57 y.o. male here for f/u UC. Last seen 03/07/13 with complaints of hematachezia at that time. Switched from [employer] to [employer]. Called in one week later with continued complaints. Rowasa added. States he does not feel any better. Actually having worsened brbpr. C/O increased gas. Two BMs per day. No fever. No recent abx. No abdominal pain.  Current Outpatient Prescriptions  Medication Sig Dispense Refill  . acetaminophen (TYLENOL) 500 MG tablet Take 500 mg by mouth every 6 (six) hours as needed for pain.      Marland Kitchen EPINEPHrine (EPIPEN) 0.3 mg/0.3 mL SOAJ injection Inject 0.3 mLs (0.3 mg total) into the muscle once.  2 Device  11  . Mesalamine (DELZICOL) 400 MG CPDR DR capsule Take 2 capsules (800 mg total) by mouth 3 (three) times daily.  180 capsule  11  . mesalamine (ROWASA) 4 G enema Place 60 mLs (4 g total) rectally at bedtime.  30 Bottle  0   No current facility-administered medications for this visit.    Allergies as of 03/28/2013 - Review Complete 03/28/2013  Allergen Reaction Noted  . Bee venom Anaphylaxis 10/30/2012    ROS:  General: Negative for anorexia, weight loss, fever, chills, fatigue, weakness. ENT: Negative for hoarseness, difficulty swallowing , nasal congestion. CV: Negative for chest pain, angina, palpitations, dyspnea on exertion, peripheral edema.  Respiratory: Negative for dyspnea at rest, dyspnea on exertion, cough, sputum, wheezing.  GI: See history of present illness. GU:  Negative for dysuria, hematuria, urinary incontinence, urinary frequency, nocturnal urination.  Endo: Negative for unusual weight change.    Physical Examination:   BP 104/65  Pulse 83  Temp(Src) 97.4 F (36.3 C) (Oral)  Wt 219 lb 12.8 oz (99.701 kg)  General: Well-nourished, well-developed in no acute  distress.  Eyes: No icterus. Mouth: Oropharyngeal mucosa moist and pink , no lesions erythema or exudate. Lungs: Clear to auscultation bilaterally.  Heart: Regular rate and rhythm, no murmurs rubs or gallops.  Abdomen: Bowel sounds are normal, nontender, nondistended, no hepatosplenomegaly or masses, no abdominal bruits or hernia , no rebound or guarding.   Rectal: no abnormalities noted. nontender. Extremities: No lower extremity edema. No clubbing or deformities. Neuro: Alert and oriented x 4   Skin: Warm and dry, no jaundice.   Psych: Alert and cooperative, normal mood and affect.

## 2013-03-30 NOTE — Assessment & Plan Note (Signed)
Ongoing flare of proctocolitis. Continue Delzicol 800 mg TID. Add Uceris 107m daily. Continue for 8 weeks and then consider taper. Ov in 8 weeks. Call in one week if no noted improvement.

## 2013-04-01 NOTE — Progress Notes (Signed)
cc'd to pcp 

## 2013-05-28 ENCOUNTER — Ambulatory Visit: Payer: Managed Care, Other (non HMO) | Admitting: Gastroenterology

## 2013-08-19 ENCOUNTER — Telehealth: Payer: Self-pay | Admitting: Internal Medicine

## 2013-08-19 ENCOUNTER — Other Ambulatory Visit: Payer: Self-pay

## 2013-08-19 MED ORDER — BUDESONIDE 9 MG PO TB24: 9.0000 mg | ORAL_TABLET | Freq: Every day | ORAL | Status: DC

## 2013-08-19 NOTE — Telephone Encounter (Signed)
Needs OV. One refill until then.

## 2013-08-19 NOTE — Telephone Encounter (Signed)
Pt called this afternoon wanting to speak with nurse regarding a medication and if he was able to get a refill or if he needed to follow up with RMR first. I told him that the nurse would be back later today and i would have her call him back at [phone]

## 2013-08-19 NOTE — Telephone Encounter (Signed)
Patient missed his f/u in 05/2013.  He should come off of Uceris if doing well. Not intended for long-term management of his UC.  Needs f/u OV.

## 2013-08-19 NOTE — Telephone Encounter (Signed)
He doing ok but when he is off of the medication about a month the bleeding comes back, but if he starts taking the Uceris it stops. He feels states no pain and feels good. Please advise

## 2013-08-20 NOTE — Telephone Encounter (Signed)
He has a follow up appointment on April 29 @11 :00 with LSL

## 2013-08-20 NOTE — Telephone Encounter (Signed)
Ginger spoke with pt. See refill request.

## 2013-08-28 ENCOUNTER — Ambulatory Visit (INDEPENDENT_AMBULATORY_CARE_PROVIDER_SITE_OTHER): Payer: Managed Care, Other (non HMO) | Admitting: *Deleted

## 2013-08-28 DIAGNOSIS — Z23 Encounter for immunization: Secondary | ICD-10-CM

## 2013-08-28 NOTE — Patient Instructions (Signed)
Tetanus, Diphtheria, Pertussis (Tdap) Vaccine What You Need to Know WHY GET VACCINATED? Tetanus, diphtheria and pertussis can be very serious diseases, even for adolescents and adults. Tdap vaccine can protect [place] from these diseases. TETANUS (Lockjaw) causes painful muscle tightening and stiffness, usually all over the body.  It can lead to tightening of muscles in the head and neck so you can't open your mouth, swallow, or sometimes even breathe. Tetanus kills about 1 out of 5 people who are infected. DIPHTHERIA can cause a thick coating to form in the back of the throat.  It can lead to breathing problems, paralysis, heart failure, and death. PERTUSSIS (Whooping Cough) causes severe coughing spells, which can cause difficulty breathing, vomiting and disturbed sleep.  It can also lead to weight loss, incontinence, and rib fractures. Up to 2 in 100 adolescents and 5 in 100 adults with pertussis are hospitalized or have complications, which could include pneumonia and death. These diseases are caused by bacteria. Diphtheria and pertussis are spread from person to person through coughing or sneezing. Tetanus enters the body through cuts, scratches, or wounds. Before vaccines, the [place] saw as many as 200,000 cases a year of diphtheria and pertussis, and hundreds of cases of tetanus. Since vaccination began, tetanus and diphtheria have dropped by about 99% and pertussis by about 80%. TDAP VACCINE Tdap vaccine can protect adolescents and adults from tetanus, diphtheria, and pertussis. One dose of Tdap is routinely given at age 46 or 65. People who did not get Tdap at that age should get it as soon as possible. Tdap is especially important for health care professionals and anyone having close contact with a baby younger than 12 months. Pregnant women should get a dose of Tdap during every pregnancy, to protect the newborn from pertussis. Infants are most at risk for severe, life-threatening  complications from pertussis. A similar vaccine, called Td, protects from tetanus and diphtheria, but not pertussis. A Td booster should be given every 10 years. Tdap may be given as one of these boosters if you have not already gotten a dose. Tdap may also be given after a severe cut or burn to prevent tetanus infection. Your doctor can give you more information. Tdap may safely be given at the same time as other vaccines. SOME PEOPLE SHOULD NOT GET THIS VACCINE  If you ever had a life-threatening allergic reaction after a dose of any tetanus, diphtheria, or pertussis containing vaccine, OR if you have a severe allergy to any part of this vaccine, you should not get Tdap. Tell your doctor if you have any severe allergies.  If you had a coma, or long or multiple seizures within 7 days after a childhood dose of DTP or DTaP, you should not get Tdap, unless a cause other than the vaccine was found. You can still get Td.  Talk to your doctor if you:  have epilepsy or another nervous system problem,  had severe pain or swelling after any vaccine containing diphtheria, tetanus or pertussis,  ever had Guillain-Barr Syndrome (GBS),  aren't feeling well on the day the shot is scheduled. RISKS OF A VACCINE REACTION With any medicine, including vaccines, there is a chance of side effects. These are usually mild and go away on their own, but serious reactions are also possible. Brief fainting spells can follow a vaccination, leading to injuries from falling. Sitting or lying down for about 15 minutes can help prevent these. Tell your doctor if you feel dizzy or light-headed, or  have vision changes or ringing in the ears. Mild problems following Tdap (Did not interfere with activities)  Pain where the shot was given (about 3 in 4 adolescents or 2 in 3 adults)  Redness or swelling where the shot was given (about 1 person in 5)  Mild fever of at least 100.40F (up to about 1 in 25 adolescents or 1 in  100 adults)  Headache (about 3 or 4 people in 10)  Tiredness (about 1 person in 3 or 4)  Nausea, vomiting, diarrhea, stomach ache (up to 1 in 4 adolescents or 1 in 10 adults)  Chills, body aches, sore joints, rash, swollen glands (uncommon) Moderate problems following Tdap (Interfered with activities, but did not require medical attention)  Pain where the shot was given (about 1 in 5 adolescents or 1 in 100 adults)  Redness or swelling where the shot was given (up to about 1 in 16 adolescents or 1 in 25 adults)  Fever over 102F (about 1 in 100 adolescents or 1 in 250 adults)  Headache (about 3 in 20 adolescents or 1 in 10 adults)  Nausea, vomiting, diarrhea, stomach ache (up to 1 or 3 people in 100)  Swelling of the entire arm where the shot was given (up to about 3 in 100). Severe problems following Tdap (Unable to perform usual activities, required medical attention)  Swelling, severe pain, bleeding and redness in the arm where the shot was given (rare). A severe allergic reaction could occur after any vaccine (estimated less than 1 in a million doses). WHAT IF THERE IS A SERIOUS REACTION? What should I look for?  Look for anything that concerns you, such as signs of a severe allergic reaction, very high fever, or behavior changes. Signs of a severe allergic reaction can include hives, swelling of the face and throat, difficulty breathing, a fast heartbeat, dizziness, and weakness. These would start a few minutes to a few hours after the vaccination. What should I do?  If you think it is a severe allergic reaction or other emergency that can't wait, call 9-1-1 or get the person to the nearest hospital. Otherwise, call your doctor.  Afterward, the reaction should be reported to the "Vaccine Adverse Event Reporting System" (VAERS). Your doctor might file this report, or you can do it yourself through the VAERS web site at www.vaers.SamedayNews.es, or by calling (252) 628-6441. VAERS is  only for reporting reactions. They do not give medical advice.  THE NATIONAL VACCINE INJURY COMPENSATION PROGRAM The National Vaccine Injury Compensation Program (VICP) is a federal program that was created to compensate people who may have been injured by certain vaccines. Persons who believe they may have been injured by a vaccine can learn about the program and about filing a claim by calling 929-505-6023 or visiting the [place] website at GoldCloset.com.ee. HOW CAN I LEARN MORE?  Ask your doctor.  Call your local or state health department.  Contact the Centers for Disease Control and Prevention (CDC):  Call 762-325-6008 or visit CDC's website at http://hunter.com/. CDC Tdap Vaccine VIS (09/29/11) Document Released: 11/08/2011 Document Revised: 09/03/2012 Document Reviewed: 08/29/2012 [facility] Patient Information 2014 [place], [place].

## 2013-08-28 NOTE — Progress Notes (Signed)
Patient here for Tdap. Patient given and tolerated well.

## 2013-09-18 ENCOUNTER — Ambulatory Visit (INDEPENDENT_AMBULATORY_CARE_PROVIDER_SITE_OTHER): Payer: Managed Care, Other (non HMO) | Admitting: Gastroenterology

## 2013-09-18 ENCOUNTER — Encounter: Payer: Self-pay | Admitting: Gastroenterology

## 2013-09-18 VITALS — BP 120/77 | HR 63 | Temp 97.6°F | Ht 74.0 in | Wt 215.4 lb

## 2013-09-18 DIAGNOSIS — K51211 Ulcerative (chronic) proctitis with rectal bleeding: Secondary | ICD-10-CM

## 2013-09-18 DIAGNOSIS — K5120 Ulcerative (chronic) proctitis without complications: Secondary | ICD-10-CM

## 2013-09-18 NOTE — Patient Instructions (Addendum)
1. Finish up [employer]. 2. Continue Delzicol. 3. Start Rowasa (mesalamine) enemas, one at bedtime three times per week. 4. Call if you have recurrent rectal bleeding.  5. Office visit in four months with Dr. Gala Romney.  6. You will need bone density study some time in the next six months. Let me know when you are ready.  Delzicol can cause muscle aches/cramps. Let me know if symptoms are more frequent.

## 2013-09-18 NOTE — Progress Notes (Signed)
      Primary Care Physician: Redge Gainer, MD  Primary Gastroenterologist:  Garfield Cornea, MD   Chief Complaint  Patient presents with  . Follow-up    HPI: Kyle Sexton is a 58 y.o. male here for followup of UC. He has left-sided disease documented at time of colonoscopy in June 2014. He was initiated on mesalamine therapy (initially Colazal and then Delzicol) and subsequently Uceris added. Has had Rowasa enemas as well but patient tells me he only took a few in the past. He was last seen in November. It was at that time we added Uceris 74m daily due to rectal bleeding.   Patient states he does fairly well until he comes off of Uceris. Then he has recurrent rectal bleeding. He has been back on the medication for 4 weeks. Denies any further rectal bleeding. Has not had any recent tenesmus or diarrhea like he had prior to his diagnosis. Denies abdominal pain. Feels great.    Current Outpatient Prescriptions  Medication Sig Dispense Refill  . acetaminophen (TYLENOL) 500 MG tablet Take 500 mg by mouth every 6 (six) hours as needed for pain.      . Budesonide (UCERIS) 9 MG TB24 Take 9 mg by mouth daily.  30 tablet  0  . EPINEPHrine (EPIPEN) 0.3 mg/0.3 mL SOAJ injection Inject 0.3 mLs (0.3 mg total) into the muscle once.  2 Device  11  . Mesalamine (DELZICOL) 400 MG CPDR DR capsule Take 2 capsules (800 mg total) by mouth 3 (three) times daily.  180 capsule  11   No current facility-administered medications for this visit.    Allergies as of 09/18/2013 - Review Complete 09/18/2013  Allergen Reaction Noted  . Bee venom Anaphylaxis 10/30/2012    ROS:  General: Negative for anorexia, weight loss, fever, chills, fatigue, weakness. ENT: Negative for hoarseness, difficulty swallowing , nasal congestion. CV: Negative for chest pain, angina, palpitations, dyspnea on exertion, peripheral edema.  Respiratory: Negative for dyspnea at rest, dyspnea on exertion, cough, sputum, wheezing.  GI:  See history of present illness. GU:  Negative for dysuria, hematuria, urinary incontinence, urinary frequency, nocturnal urination.  Endo: Negative for unusual weight change.    Physical Examination:   BP 120/77  Pulse 63  Temp(Src) 97.6 F (36.4 C) (Oral)  Ht 6' 2"  (1.88 m)  Wt 215 lb 6.4 oz (97.705 kg)  BMI 27.64 kg/m2  General: Well-nourished, well-developed in no acute distress.  Eyes: No icterus. Mouth: Oropharyngeal mucosa moist and pink , no lesions erythema or exudate. Lungs: Clear to auscultation bilaterally.  Heart: Regular rate and rhythm, no murmurs rubs or gallops.  Abdomen: Bowel sounds are normal, nontender, nondistended, no hepatosplenomegaly or masses, no abdominal bruits or hernia , no rebound or guarding.   Extremities: No lower extremity edema. No clubbing or deformities. Neuro: Alert and oriented x 4   Skin: Warm and dry, no jaundice.   Psych: Alert and cooperative, normal mood and affect.

## 2013-09-18 NOTE — Progress Notes (Signed)
CC'd to pcp

## 2013-09-18 NOTE — Assessment & Plan Note (Signed)
Left-sided proctocolitis, went into remission on Uceris but with stopping medication he began having recurrent rectal bleeding. Discussed need to avoid long-term therapy with Uceris. Continue Delzicol at 834m TID. We will add Rowasa enemas TIW. Hopefully he will maintain remission when stopping uceris this time. He will call with recurrent bleeding. If continues to have problems, consider flex sig for reevaluation. OV in four months with Dr. RGala Romneyonly.

## 2013-10-04 ENCOUNTER — Encounter: Payer: Self-pay | Admitting: Nurse Practitioner

## 2013-10-04 ENCOUNTER — Ambulatory Visit (INDEPENDENT_AMBULATORY_CARE_PROVIDER_SITE_OTHER): Payer: Managed Care, Other (non HMO) | Admitting: Nurse Practitioner

## 2013-10-04 VITALS — BP 108/65 | HR 62 | Temp 97.6°F | Ht 74.0 in | Wt 217.0 lb

## 2013-10-04 DIAGNOSIS — E785 Hyperlipidemia, unspecified: Secondary | ICD-10-CM

## 2013-10-04 DIAGNOSIS — N400 Enlarged prostate without lower urinary tract symptoms: Secondary | ICD-10-CM

## 2013-10-04 DIAGNOSIS — Z0000 Encounter for general adult medical examination without abnormal findings: Secondary | ICD-10-CM

## 2013-10-04 NOTE — Progress Notes (Signed)
   Subjective:    Patient ID: Matteo Banke Wirsing, male    DOB: [birth year], 58 y.o.   MRN: [id number]   Patient here today for annual physical exam- he is doing well- no complaints today.  Hyperlipidemia This is a chronic problem. The current episode started more than 1 year ago. The problem is uncontrolled. Recent lipid tests were reviewed and are high. He has no history of diabetes, hypothyroidism or obesity. Current antihyperlipidemic treatment includes diet change. There are no compliance problems.  Risk factors for coronary artery disease include dyslipidemia, family history and male sex.  colitis No bleeding- currently under control with meds.  Review of Systems  Constitutional: Negative.   HENT: Negative.   Respiratory: Negative.   Cardiovascular: Negative.   Gastrointestinal: Negative.   Genitourinary: Negative.   Neurological: Negative.   Hematological: Negative.   Psychiatric/Behavioral: Negative.   All other systems reviewed and are negative.      Objective:   Physical Exam  Constitutional: He is oriented to person, place, and time. He appears well-developed and well-nourished.  HENT:  Head: Normocephalic.  Right Ear: External ear normal.  Left Ear: External ear normal.  Nose: Nose normal.  Mouth/Throat: Oropharynx is clear and moist.  Eyes: EOM are normal. Pupils are equal, round, and reactive to light.  Neck: Normal range of motion. Neck supple. No JVD present. No thyromegaly present.  Cardiovascular: Normal rate, regular rhythm, normal heart sounds and intact distal pulses.  Exam reveals no gallop and no friction rub.   No murmur heard. Large varicose veins bil lower ext.  Pulmonary/Chest: Effort normal and breath sounds normal. No respiratory distress. He has no wheezes. He has no rales. He exhibits no tenderness.  Abdominal: Soft. Bowel sounds are normal. He exhibits no mass. There is no tenderness.  Genitourinary:  Rectal exam referred to urologist    Musculoskeletal: Normal range of motion. He exhibits no edema.  Lymphadenopathy:    He has no cervical adenopathy.  Neurological: He is alert and oriented to person, place, and time. No cranial nerve deficit.  Skin: Skin is warm and dry.  Psychiatric: He has a normal mood and affect. His behavior is normal. Judgment and thought content normal.    BP 108/65  Pulse 62  Temp(Src) 97.6 F (36.4 C) (Oral)  Ht _0  (1.88 m)  Wt 217 lb (98.431 kg)  BMI 27.85 kg/m2       Assessment & Plan:   1. Annual physical exam   2. Hyperlipidemia   3. BPH (benign prostatic hyperplasia)    Orders Placed This Encounter  Procedures  . CMP14+EGFR  . NMR, lipoprofile  . PSA, total and free   Follow up with urologist for prostate check Continue all meds Labs pending Diet and exercise encouraged   Mary-Margaret Hassell Done, FNP

## 2013-10-05 LAB — CMP14+EGFR
ALT: 24 IU/L (ref 0–44)
AST: 21 IU/L (ref 0–40)
Albumin/Globulin Ratio: 2.3 (ref 1.1–2.5)
Albumin: 4.2 g/dL (ref 3.5–5.5)
Alkaline Phosphatase: 52 IU/L (ref 39–117)
BUN/Creatinine Ratio: 17 (ref 9–20)
BUN: 16 mg/dL (ref 6–24)
CO2: 27 mmol/L (ref 18–29)
Calcium: 9.4 mg/dL (ref 8.7–10.2)
Chloride: 101 mmol/L (ref 97–108)
Creatinine, Ser: 0.92 mg/dL (ref 0.76–1.27)
GFR calc Af Amer: 106 mL/min/{1.73_m2} (ref >59)
GFR calc non Af Amer: 91 mL/min/{1.73_m2} (ref >59)
Globulin, Total: 1.8 g/dL (ref 1.5–4.5)
Glucose: 83 mg/dL (ref 65–99)
Potassium: 4.6 mmol/L (ref 3.5–5.2)
Sodium: 142 mmol/L (ref 134–144)
Total Bilirubin: 0.5 mg/dL (ref 0.0–1.2)
Total Protein: 6 g/dL (ref 6.0–8.5)

## 2013-10-05 LAB — NMR, LIPOPROFILE
Cholesterol: 175 mg/dL (ref 100–199)
HDL Cholesterol by NMR: 54 mg/dL (ref >39)
HDL Particle Number: 38.4 umol/L (ref >=30.5)
LDL Particle Number: 1526 nmol/L — ABNORMAL HIGH (ref <1000)
LDL Size: 20.3 nm (ref >20.5)
LDLC SERPL CALC-MCNC: 109 mg/dL — ABNORMAL HIGH (ref 0–99)
LP-IR Score: 36 (ref <=45)
Small LDL Particle Number: 856 nmol/L — ABNORMAL HIGH (ref <=527)
Triglycerides by NMR: 59 mg/dL (ref 0–149)

## 2013-10-05 LAB — PSA, TOTAL AND FREE
PSA, Free Pct: 33.6 %
PSA, Free: 0.37 ng/mL
PSA: 1.1 ng/mL (ref 0.0–4.0)

## 2013-10-05 LAB — SPECIMEN STATUS REPORT

## 2013-10-21 ENCOUNTER — Ambulatory Visit (INDEPENDENT_AMBULATORY_CARE_PROVIDER_SITE_OTHER): Payer: Managed Care, Other (non HMO) | Admitting: Family Medicine

## 2013-10-21 VITALS — BP 111/72 | HR 74 | Temp 97.6°F | Ht 74.0 in | Wt 219.0 lb

## 2013-10-21 DIAGNOSIS — T7840XA Allergy, unspecified, initial encounter: Secondary | ICD-10-CM

## 2013-10-21 MED ORDER — METHYLPREDNISOLONE ACETATE 80 MG/ML IJ SUSP
80.0000 mg | Freq: Once | INTRAMUSCULAR | Status: AC
  Administered 2013-10-21: 80 mg via INTRAMUSCULAR

## 2013-10-21 MED ORDER — EPINEPHRINE HCL 1 MG/ML IJ SOLN
0.3000 mL | Freq: Once | INTRAMUSCULAR | Status: AC
  Administered 2013-10-21: 0.3 mg via SUBCUTANEOUS

## 2013-10-21 NOTE — Progress Notes (Signed)
   Subjective:    Patient ID: Kyle Sexton, male    DOB: [birth year], 58 y.o.   MRN: [id number]  HPI  Patient comes into clinic emergently with c/o bee sting on his tongue and c/o swelling and  Having a rxn to the bee sting. He left his epi-pen at home.  He has taken a benadryl.  He states he has this at least once a year.  Review of Systems C/o tongue swelling and bee sting No chest pain, SOB, HA, dizziness, vision change, N/V, diarrhea, constipation, dysuria, urinary urgency or frequency, myalgias, arthralgias or rash.     Objective:   Physical Exam  Vital signs noted  Well developed well nourished male.  HEENT - Head atraumatic Normocephalic                Eyes - PERRLA, Conjuctiva - clear Sclera- Clear EOMI                Ears - EAC's Wnl TM's Wnl Gross Hearing                Throat - oropharanx wnl, erythematous region on the tip of his tongue which does not appear                               Swollen. Respiratory - Lungs with wheeze right chest and CTA left chest Cardiac - RRR S1 and S2 without murmur GI - Abdomen soft Nontender and bowel sounds active x 4 Extremities - No edema. Neuro - Grossly intact.      Assessment & Plan:  Allergic reaction - Plan: EPINEPHrine (ADRENALIN) injection 0.3 mg, methylPREDNISolone acetate (DEPO-MEDROL) injection 80 mg  Bee sting - Epi 0.68m sq and depomedrol 819mIM and take benadryl otc and carry epi pen with you And follow up prn.  Offered medrol dose pack and wants to wait. Follow up prn or call 911 if any recurrence.  After 30 minutes after shots patient does not have any sx's and states he feels great and is ready to leave.  WiLysbeth PennerNP

## 2013-10-21 NOTE — Patient Instructions (Signed)
Bee, Wasp, or Hornet Sting Your caregiver has diagnosed you as having an insect sting. An insect sting appears as a red lump in the skin that sometimes has a tiny hole in the center, or it may have a stinger in the center of the wound. The most common stings are from wasps, hornets and bees. Individuals have different reactions to insect stings.  A normal reaction may cause pain, swelling, and redness around the sting site.  A localized allergic reaction may cause swelling and redness that extends beyond the sting site.  A large local reaction may continue to develop over the next 12 to 36 hours.  On occasion, the reactions can be severe (anaphylactic reaction). An anaphylactic reaction may cause wheezing; difficulty breathing; chest pain; fainting; raised, itchy, red patches on the skin; a sick feeling to your stomach (nausea); vomiting; cramping; or diarrhea. If you have had an anaphylactic reaction to an insect sting in the past, you are more likely to have one again. HOME CARE INSTRUCTIONS   With bee stings, a small sac of poison is left in the wound. Brushing across this with something such as a credit card, or anything similar, will help remove this and decrease the amount of the reaction. This same procedure will not help a wasp sting as they do not leave behind a stinger and poison sac.  Apply a cold compress for 10 to 20 minutes every hour for 1 to 2 days, depending on severity, to reduce swelling and itching.  To lessen pain, a paste made of water and baking soda may be rubbed on the bite or sting and left on for 5 minutes.  To relieve itching and swelling, you may use take medication or apply medicated creams or lotions as directed.  Only take over-the-counter or prescription medicines for pain, discomfort, or fever as directed by your caregiver.  Wash the sting site daily with soap and water. Apply antibiotic ointment on the sting site as directed.  If you suffered a severe  reaction:  If you did not require hospitalization, an adult will need to stay with you for 24 hours in case the symptoms return.  You may need to wear a medical bracelet or necklace stating the allergy.  You and your family need to learn when and how to use an anaphylaxis kit or epinephrine injection.  If you have had a severe reaction before, always carry your anaphylaxis kit with you. SEEK MEDICAL CARE IF:   None of the above helps within 2 to 3 days.  The area becomes red, warm, tender, and swollen beyond the area of the bite or sting.  You have an oral temperature above 102 F (38.9 C). SEEK IMMEDIATE MEDICAL CARE IF:  You have symptoms of an allergic reaction which are:  Wheezing.  Difficulty breathing.  Chest pain.  Lightheadedness or fainting.  Itchy, raised, red patches on the skin.  Nausea, vomiting, cramping or diarrhea. ANY OF THESE SYMPTOMS MAY REPRESENT A SERIOUS PROBLEM THAT IS AN EMERGENCY. Do not wait to see if the symptoms will go away. Get medical help right away. Call your local emergency services (911 in [place] DO NOT drive yourself to the hospital. MAKE SURE YOU:   Understand these instructions.  Will watch your condition.  Will get help right away if you are not doing well or get worse. Document Released: 05/09/2005 Document Revised: 08/01/2011 Document Reviewed: 10/24/2009 [employer] Patient Information 2014 [employer].  

## 2013-11-04 ENCOUNTER — Telehealth: Payer: Self-pay | Admitting: *Deleted

## 2013-11-04 NOTE — Telephone Encounter (Signed)
Pt called wanting to speak with Magda Paganini pt said she changed his medication and he is doing really good, pt stated Magda Paganini wants to reduce his dosage and pt wants to speak with Magda Paganini about it. Please advise [phone]

## 2013-11-06 NOTE — Telephone Encounter (Signed)
Feels well. Not using Rowasa anymore. He dropped back to 878m BID on Delzicol and is doing well. This is standard maintenance dose so recommend he continue. OV in 12/2013 or 01/2014 with RMR as previously noted.

## 2013-11-06 NOTE — Telephone Encounter (Signed)
Pt called and said he left message for Magda Paganini to call him on Mon and he has not heard from her.  I explained to him that Leigh leaves earlier on Mon and Tues.  I will give her the message.  He said he is doing fine, he just needs to discuss the medications.

## 2013-11-08 NOTE — Telephone Encounter (Signed)
Routing to [place] to [employer].

## 2013-11-11 NOTE — Telephone Encounter (Signed)
Reminder in epic to follow up in August 2015 with RMR

## 2014-03-24 NOTE — Telephone Encounter (Signed)
Open in error

## 2014-03-31 ENCOUNTER — Telehealth: Payer: Self-pay | Admitting: *Deleted

## 2014-03-31 NOTE — Telephone Encounter (Signed)
Patient called stating that [facility] in [address] has not received refill request   Please call [phone]

## 2014-04-01 ENCOUNTER — Other Ambulatory Visit: Payer: Self-pay

## 2014-04-01 ENCOUNTER — Encounter: Payer: Self-pay | Admitting: Internal Medicine

## 2014-04-01 MED ORDER — MESALAMINE 400 MG PO CPDR: 800.0000 mg | DELAYED_RELEASE_CAPSULE | Freq: Three times a day (TID) | ORAL | Status: DC

## 2014-04-01 NOTE — Telephone Encounter (Signed)
done

## 2014-04-01 NOTE — Telephone Encounter (Signed)
Routing to the refill box. 

## 2014-04-01 NOTE — Telephone Encounter (Signed)
RX done. Needs nonurgent OV with RMR only.

## 2014-04-01 NOTE — Telephone Encounter (Signed)
APPT MADE AND LETTER SENT  °

## 2014-05-13 ENCOUNTER — Ambulatory Visit: Payer: Managed Care, Other (non HMO) | Admitting: Internal Medicine

## 2014-06-10 ENCOUNTER — Ambulatory Visit: Payer: Managed Care, Other (non HMO) | Admitting: Internal Medicine

## 2014-06-13 ENCOUNTER — Ambulatory Visit: Payer: Managed Care, Other (non HMO) | Admitting: Internal Medicine

## 2014-07-25 ENCOUNTER — Encounter: Payer: Self-pay | Admitting: Internal Medicine

## 2014-07-25 ENCOUNTER — Ambulatory Visit (INDEPENDENT_AMBULATORY_CARE_PROVIDER_SITE_OTHER): Payer: Managed Care, Other (non HMO) | Admitting: Internal Medicine

## 2014-07-25 VITALS — BP 112/62 | HR 69 | Temp 97.4°F | Ht 76.0 in | Wt 218.0 lb

## 2014-07-25 DIAGNOSIS — K5120 Ulcerative (chronic) proctitis without complications: Secondary | ICD-10-CM

## 2014-07-25 NOTE — Patient Instructions (Signed)
Continue Delzicol 800 mg twice daily   Avoid NSAID agents as much as possible   Office visit in 1 year with me

## 2014-07-25 NOTE — Progress Notes (Signed)
Primary Care Physician:  Chevis Pretty, FNP Primary Gastroenterologist:  Dr. Gala Romney  Pre-Procedure History & Physical: HPI:  Kyle Sexton is a 59 y.o. male here for follow up for left sided proctocolitis. Delzicol 800 mg twice a day. Patient reports doing great. Normal bowel function. No bleeding. No tenesmus. No diarrhea. No abdominal pain He feels well. He has asked about dropping the dose even further. No nonsteroidal agents recently. History of colonic adenoma removed 2007.; Due for surveillance colonoscopy 2019.  Serum creatinine normal 9 months ago.  Past Medical History  Diagnosis Date  . Hyperlipidemia   . Ulcerative proctocolitis without complication     Past Surgical History  Procedure Laterality Date  . Knee surgery      both  . Colonoscopy  2007    Dr. Gala Romney: diminutive rectal polyp and 2X2 cm complex, carpet-like polyp, removed via piecemeal fashion, with remaining tissue ablated with APC. fragments of tubular adenoma  . Colonoscopy N/A 11/06/2012    UXN:ATFT sided proctocolitis. Colonic diverticulosis    Prior to Admission medications   Medication Sig Start Date End Date Taking? Authorizing Provider  acetaminophen (TYLENOL) 500 MG tablet Take 500 mg by mouth every 6 (six) hours as needed for pain.   Yes Historical Provider, MD  Budesonide (UCERIS) 9 MG TB24 Take 9 mg by mouth daily. 08/19/13  Yes Mahala Menghini, PA-C  EPINEPHrine (EPIPEN) 0.3 mg/0.3 mL SOAJ injection Inject 0.3 mLs (0.3 mg total) into the muscle once. 12/26/12  Yes Mary-Margaret Hassell Done, FNP  Mesalamine (DELZICOL) 400 MG CPDR DR capsule Take 2 capsules (800 mg total) by mouth 3 (three) times daily. Patient taking differently: Take 800 mg by mouth 2 (two) times daily.  04/01/14  Yes Mahala Menghini, PA-C  mesalamine (ROWASA) 4 G enema One enema three times weekly. 09/18/13   Mahala Menghini, PA-C    Allergies as of 07/25/2014 - Review Complete 07/25/2014  Allergen Reaction Noted  . Bee venom  Anaphylaxis 10/30/2012    Family History  Problem Relation Age of Onset  . Crohn's disease Son     History   Social History  . Marital Status: Married    Spouse Name: N/A  . Number of Children: N/A  . Years of Education: N/A   Occupational History  . Not on file.   Social History Main Topics  . Smoking status: Former Research scientist (life sciences)  . Smokeless tobacco: Current User    Types: Chew  . Alcohol Use: No  . Drug Use: No  . Sexual Activity: Not on file   Other Topics Concern  . Not on file   Social History Narrative    Review of Systems: See HPI, otherwise negative ROS  Physical Exam: BP 112/62 mmHg  Pulse 69  Temp(Src) 97.4 F (36.3 C)  Ht 6' 4"  (1.93 m)  Wt 218 lb (98.884 kg)  BMI 26.55 kg/m2 General:   Alert,  Well-developed, well-nourished, pleasant and cooperative in NAD Skin:  Intact without significant lesions or rashes. Eyes:  Sclera clear, no icterus.   Conjunctiva pink. Ears:  Normal auditory acuity. Nose:  No deformity, discharge,  or lesions. Mouth:  No deformity or lesions. Neck:  Supple; no masses or thyromegaly. No significant cervical adenopathy. Lungs:  Clear throughout to auscultation.   No wheezes, crackles, or rhonchi. No acute distress. Heart:  Regular rate and rhythm; no murmurs, clicks, rubs,  or gallops. Abdomen: Non-distended, normal bowel sounds.  Soft and nontender without appreciable mass or hepatosplenomegaly.  Pulses:  Normal pulses noted. Extremities:  Without clubbing or edema.  Impression:  Left sided proctocolitis in remission. History of colonic adenoma. Discussed decreasing mesalamine even further. I told the patient I did not feel it was a good idea at this point in time he is lower than standard dose as it is. I feel the benefits of continuing therapy outweigh the risks. Down the line, we could consider inching him down a little further but not at this time. Periodic assessment of renal function reviewed and recommended. Serum  creatinine normal 9 months ago.  Recommendations:  Continue Delzicol 800 mg twice daily   Avoid NSAID agents as much as possible   Office visit in 1 year with me    Notice: This dictation was prepared with Dragon dictation along with smaller phrase technology. Any transcriptional errors that result from this process are unintentional and may not be corrected upon review.

## 2014-08-14 ENCOUNTER — Telehealth: Payer: Self-pay | Admitting: *Deleted

## 2014-08-14 MED ORDER — ALPRAZOLAM 0.5 MG PO TABS: 0.5000 mg | ORAL_TABLET | Freq: Two times a day (BID) | ORAL | Status: DC

## 2014-08-14 MED ORDER — CITALOPRAM HYDROBROMIDE 20 MG PO TABS: 20.0000 mg | ORAL_TABLET | Freq: Every day | ORAL | Status: DC

## 2014-08-14 NOTE — Telephone Encounter (Signed)
Patient called provider directly and stated that his wife told him to move out. Provider contacted nurse to have meds called to pharmacy. Sent in Celexa 24m 1qd #30 with 3 RFs and Xanax 0.540m1 po bid #60 with 0 RFs. Called to [facility] per provider request

## 2014-09-09 ENCOUNTER — Other Ambulatory Visit: Payer: Self-pay | Admitting: Nurse Practitioner

## 2014-09-10 NOTE — Telephone Encounter (Signed)
Last filled 08/14/14, last seen 10/04/13. Uses [facility]

## 2014-09-10 NOTE — Telephone Encounter (Signed)
Refill left on voicemail at [facility]

## 2014-09-10 NOTE — Telephone Encounter (Signed)
Please call in xanax with 1 refills 

## 2014-09-12 ENCOUNTER — Encounter: Payer: Self-pay | Admitting: Nurse Practitioner

## 2014-09-12 ENCOUNTER — Ambulatory Visit (INDEPENDENT_AMBULATORY_CARE_PROVIDER_SITE_OTHER): Payer: Managed Care, Other (non HMO) | Admitting: Nurse Practitioner

## 2014-09-12 VITALS — BP 114/63 | HR 70 | Temp 98.4°F | Ht 76.0 in | Wt 205.2 lb

## 2014-09-12 DIAGNOSIS — F411 Generalized anxiety disorder: Secondary | ICD-10-CM | POA: Diagnosis not present

## 2014-09-12 DIAGNOSIS — N529 Male erectile dysfunction, unspecified: Secondary | ICD-10-CM

## 2014-09-12 MED ORDER — SILDENAFIL CITRATE 100 MG PO TABS: 50.0000 mg | ORAL_TABLET | Freq: Every day | ORAL | Status: DC | PRN

## 2014-09-12 NOTE — Progress Notes (Signed)
   Subjective:    Patient ID: Kyle Sexton, male    DOB: September 13, 1955, 59 y.o.   MRN: [id number]  HPI Patient in to discuss anxiety- e is currently separated from his wife and is having a rough time. He is on xanax and that is working well. COncerned about ED since it has been so long since he has been intimate.    Review of Systems  Constitutional: Negative.   HENT: Negative.   Respiratory: Negative.   Cardiovascular: Negative.   Genitourinary: Negative.   Neurological: Negative.   Psychiatric/Behavioral: Negative.   All other systems reviewed and are negative.      Objective:   Physical Exam  Constitutional: He is oriented to person, place, and time. He appears well-developed and well-nourished.  Cardiovascular: Normal rate, regular rhythm and normal heart sounds.   Pulmonary/Chest: Effort normal and breath sounds normal.  Neurological: He is alert and oriented to person, place, and time.  Skin: Skin is warm and dry.  Psychiatric: He has a normal mood and affect. His behavior is normal. Judgment and thought content normal.  BP 114/63 mmHg  Pulse 70  Temp(Src) 98.4 F (36.9 C) (Oral)  Ht 6' 4"  (1.93 m)  Wt 205 lb 3.2 oz (93.078 kg)  BMI 24.99 kg/m2         Assessment & Plan:  1. Erectile dysfunction, unspecified erectile dysfunction type Medications discussed - sildenafil (VIAGRA) 100 MG tablet; Take 0.5-1 tablets (50-100 mg total) by mouth daily as needed for erectile dysfunction.  Dispense: 9 tablet; Refill: 11  2. GAD (generalized anxiety disorder) *stress management  Mary-Margaret Hassell Done, FNP

## 2014-09-12 NOTE — Patient Instructions (Signed)
Stress and Stress Management Stress is a normal reaction to life events. It is what you feel when life demands more than you are used to or more than you can handle. Some stress can be useful. For example, the stress reaction can help you catch the last bus of the day, study for a test, or meet a deadline at work. But stress that occurs too often or for too long can cause problems. It can affect your emotional health and interfere with relationships and normal daily activities. Too much stress can weaken your immune system and increase your risk for physical illness. If you already have a medical problem, stress can make it worse. CAUSES  All sorts of life events may cause stress. An event that causes stress for one person may not be stressful for another person. Major life events commonly cause stress. These may be positive or negative. Examples include losing your job, moving into a new home, getting married, having a baby, or losing a loved one. Less obvious life events may also cause stress, especially if they occur day after day or in combination. Examples include working long hours, driving in traffic, caring for children, being in debt, or being in a difficult relationship. SIGNS AND SYMPTOMS Stress may cause emotional symptoms including, the following:  Anxiety. This is feeling worried, afraid, on edge, overwhelmed, or out of control.  Anger. This is feeling irritated or impatient.  Depression. This is feeling sad, down, helpless, or guilty.  Difficulty focusing, remembering, or making decisions. Stress may cause physical symptoms, including the following:   Aches and pains. These may affect your head, neck, back, stomach, or other areas of your body.  Tight muscles or clenched jaw.  Low energy or trouble sleeping. Stress may cause unhealthy behaviors, including the following:   Eating to feel better (overeating) or skipping meals.  Sleeping too little, too much, or both.  Working  too much or putting off tasks (procrastination).  Smoking, drinking alcohol, or using drugs to feel better. DIAGNOSIS  Stress is diagnosed through an assessment by your health care provider. Your health care provider will ask questions about your symptoms and any stressful life events.Your health care provider will also ask about your medical history and may order blood tests or other tests. Certain medical conditions and medicine can cause physical symptoms similar to stress. Mental illness can cause emotional symptoms and unhealthy behaviors similar to stress. Your health care provider may refer you to a mental health professional for further evaluation.  TREATMENT  Stress management is the recommended treatment for stress.The goals of stress management are reducing stressful life events and coping with stress in healthy ways.  Techniques for reducing stressful life events include the following:  Stress identification. Self-monitor for stress and identify what causes stress for you. These skills may help you to avoid some stressful events.  Time management. Set your priorities, keep a calendar of events, and learn to say "no." These tools can help you avoid making too many commitments. Techniques for coping with stress include the following:  Rethinking the problem. Try to think realistically about stressful events rather than ignoring them or overreacting. Try to find the positives in a stressful situation rather than focusing on the negatives.  Exercise. Physical exercise can release both physical and emotional tension. The key is to find a form of exercise you enjoy and do it regularly.  Relaxation techniques. These relax the body and mind. Examples include yoga, meditation, tai chi, biofeedback, deep  breathing, progressive muscle relaxation, listening to music, being out in nature, journaling, and other hobbies. Again, the key is to find one or more that you enjoy and can do  regularly.  Healthy lifestyle. Eat a balanced diet, get plenty of sleep, and do not smoke. Avoid using alcohol or drugs to relax.  Strong support network. Spend time with family, friends, or other people you enjoy being around.Express your feelings and talk things over with someone you trust. Counseling or talktherapy with a mental health professional may be helpful if you are having difficulty managing stress on your own. Medicine is typically not recommended for the treatment of stress.Talk to your health care provider if you think you need medicine for symptoms of stress. HOME CARE INSTRUCTIONS  Keep all follow-up visits as directed by your health care provider.  Take all medicines as directed by your health care provider. SEEK MEDICAL CARE IF:  Your symptoms get worse or you start having new symptoms.  You feel overwhelmed by your problems and can no longer manage them on your own. SEEK IMMEDIATE MEDICAL CARE IF:  You feel like hurting yourself or someone else. Document Released: 11/02/2000 Document Revised: 09/23/2013 Document Reviewed: 01/01/2013 ExitCare Patient Information 2015 [employer]. This information is not intended to replace advice given to you by your health care provider. Make sure you discuss any questions you have with your health care provider.  

## 2014-10-06 ENCOUNTER — Other Ambulatory Visit: Payer: Self-pay | Admitting: Nurse Practitioner

## 2014-10-06 NOTE — Telephone Encounter (Signed)
rx called into pharmacy

## 2014-10-06 NOTE — Telephone Encounter (Signed)
Please advise on refill- last seen by Chevis Pretty 08/2214.

## 2014-10-06 NOTE — Telephone Encounter (Signed)
Please call in xanax with 1 refills 

## 2014-10-22 ENCOUNTER — Ambulatory Visit (INDEPENDENT_AMBULATORY_CARE_PROVIDER_SITE_OTHER): Payer: Managed Care, Other (non HMO) | Admitting: Nurse Practitioner

## 2014-10-22 ENCOUNTER — Encounter: Payer: Self-pay | Admitting: Nurse Practitioner

## 2014-10-22 VITALS — BP 115/68 | HR 57 | Temp 97.6°F | Ht 76.0 in | Wt 207.0 lb

## 2014-10-22 DIAGNOSIS — Z125 Encounter for screening for malignant neoplasm of prostate: Secondary | ICD-10-CM

## 2014-10-22 DIAGNOSIS — F411 Generalized anxiety disorder: Secondary | ICD-10-CM | POA: Diagnosis not present

## 2014-10-22 DIAGNOSIS — E785 Hyperlipidemia, unspecified: Secondary | ICD-10-CM | POA: Diagnosis not present

## 2014-10-22 DIAGNOSIS — Z0000 Encounter for general adult medical examination without abnormal findings: Secondary | ICD-10-CM

## 2014-10-22 LAB — POCT CBC
Granulocyte percent: 68 %G (ref 37–80)
HCT, POC: 49.9 % (ref 43.5–53.7)
Hemoglobin: 16.2 g/dL (ref 14.1–18.1)
Lymph, poc: 1.8 (ref 0.6–3.4)
MCH, POC: 29.9 pg (ref 27–31.2)
MCHC: 32.4 g/dL (ref 31.8–35.4)
MCV: 92.1 fL (ref 80–97)
MPV: 9.8 fL (ref 0–99.8)
POC Granulocyte: 4.3 (ref 2–6.9)
POC LYMPH PERCENT: 28.7 %L (ref 10–50)
Platelet Count, POC: 177 10*3/uL (ref 142–424)
RBC: 5.41 M/uL (ref 4.69–6.13)
RDW, POC: 13.6 %
WBC: 6.3 10*3/uL (ref 4.6–10.2)

## 2014-10-22 MED ORDER — CITALOPRAM HYDROBROMIDE 20 MG PO TABS: 20.0000 mg | ORAL_TABLET | Freq: Every day | ORAL | Status: DC

## 2014-10-22 NOTE — Progress Notes (Signed)
   Subjective:    Patient ID: Kyle Sexton, male    DOB: Dec 16, 1955, 59 y.o.   MRN: [id number]   Patient here today for follow up of chronic medical problems and annual physical. He is doing well today without complaints.   Hyperlipidemia This is a chronic problem. The problem is resistant. Recent lipid tests were reviewed and are variable. He has no history of diabetes, hypothyroidism or obesity. There are no known factors aggravating his hyperlipidemia. He is currently on no antihyperlipidemic treatment. The current treatment provides moderate improvement of lipids. There are no compliance problems.  Risk factors for coronary artery disease include dyslipidemia and male sex.  GAD Xanax BID- keeps him calm- going through a bad marital separartion.   Review of Systems  Constitutional: Negative.   HENT: Negative.   Eyes: Negative.   Respiratory: Negative.   Cardiovascular: Negative.   Gastrointestinal: Negative.   Genitourinary: Negative.   Neurological: Negative.   Psychiatric/Behavioral: Negative.   All other systems reviewed and are negative.      Objective:   Physical Exam  Constitutional: He is oriented to person, place, and time. He appears well-developed and well-nourished.  HENT:  Head: Normocephalic.  Right Ear: External ear normal.  Left Ear: External ear normal.  Nose: Nose normal.  Mouth/Throat: Oropharynx is clear and moist.  Eyes: EOM are normal. Pupils are equal, round, and reactive to light.  Neck: Normal range of motion. Neck supple. No JVD present. No thyromegaly present.  Cardiovascular: Normal rate, regular rhythm, normal heart sounds and intact distal pulses.  Exam reveals no gallop and no friction rub.   No murmur heard. Pulmonary/Chest: Effort normal and breath sounds normal. No respiratory distress. He has no wheezes. He has no rales. He exhibits no tenderness.  Abdominal: Soft. Bowel sounds are normal. He exhibits no mass. There is no tenderness.    Genitourinary: Penis normal.  Musculoskeletal: Normal range of motion. He exhibits no edema.  Lymphadenopathy:    He has no cervical adenopathy.  Neurological: He is alert and oriented to person, place, and time. No cranial nerve deficit.  Skin: Skin is warm and dry.  Psychiatric: He has a normal mood and affect. His behavior is normal. Judgment and thought content normal.   BP 115/68 mmHg  Pulse 57  Temp(Src) 97.6 F (36.4 C) (Oral)  Ht 6' 4"  (1.93 m)  Wt 207 lb (93.895 kg)  BMI 25.21 kg/m2        Assessment & Plan:   1. Hyperlipidemia   2. GAD (generalized anxiety disorder)   3. Annual physical exam    Meds ordered this encounter  Medications  . DISCONTD: citalopram (CELEXA) 20 MG tablet    Sig: Take 20 mg by mouth daily.    Refill:  3  . citalopram (CELEXA) 20 MG tablet    Sig: Take 1 tablet (20 mg total) by mouth daily.    Dispense:  30 tablet    Refill:  5    Order Specific Question:  Supervising Provider    Answer:  Chipper Herb [[id number]]   Orders Placed This Encounter  Procedures  . CMP14+EGFR  . NMR, lipoprofile  . Thyroid Panel With TSH  . POCT CBC   Diet and exercise encouraged Health maintenance emoved RTO prn  Mary-Margaret Hassell Done, FNP

## 2014-10-22 NOTE — Addendum Note (Signed)
Addended by: Chevis Pretty on: 10/22/2014 03:37 PM   Modules accepted: Orders

## 2014-10-22 NOTE — Patient Instructions (Signed)
Exercise to Stay Healthy Exercise helps you become and stay healthy. EXERCISE IDEAS AND TIPS Choose exercises that:  You enjoy.  Fit into your day. You do not need to exercise really hard to be healthy. You can do exercises at a slow or medium level and stay healthy. You can:  Stretch before and after working out.  Try yoga, Pilates, or tai chi.  Lift weights.  Walk fast, swim, jog, run, climb stairs, bicycle, dance, or rollerskate.  Take aerobic classes. Exercises that burn about 150 calories:  Running 1  miles in 15 minutes.  Playing volleyball for 45 to 60 minutes.  Washing and waxing a car for 45 to 60 minutes.  Playing touch football for 45 minutes.  Walking 1  miles in 35 minutes.  Pushing a stroller 1  miles in 30 minutes.  Playing basketball for 30 minutes.  Raking leaves for 30 minutes.  Bicycling 5 miles in 30 minutes.  Walking 2 miles in 30 minutes.  Dancing for 30 minutes.  Shoveling snow for 15 minutes.  Swimming laps for 20 minutes.  Walking up stairs for 15 minutes.  Bicycling 4 miles in 15 minutes.  Gardening for 30 to 45 minutes.  Jumping rope for 15 minutes.  Washing windows or floors for 45 to 60 minutes. Document Released: 06/11/2010 Document Revised: 08/01/2011 Document Reviewed: 06/11/2010 ExitCare Patient Information 2015 [employer]. This information is not intended to replace advice given to you by your health care provider. Make sure you discuss any questions you have with your health care provider.  

## 2014-10-23 LAB — THYROID PANEL WITH TSH
Free Thyroxine Index: 1.9 (ref 1.2–4.9)
T3 Uptake Ratio: 28 % (ref 24–39)
T4, Total: 6.9 ug/dL (ref 4.5–12.0)
TSH: 1.96 u[IU]/mL (ref 0.450–4.500)

## 2014-10-23 LAB — NMR, LIPOPROFILE
Cholesterol: 171 mg/dL (ref 100–199)
HDL Cholesterol by NMR: 56 mg/dL (ref >39)
HDL Particle Number: 37.7 umol/L (ref >=30.5)
LDL Particle Number: 1304 nmol/L — ABNORMAL HIGH (ref <1000)
LDL Size: 20.6 nm (ref >20.5)
LDL-C: 100 mg/dL — ABNORMAL HIGH (ref 0–99)
LP-IR Score: <25 (ref <=45)
Small LDL Particle Number: 525 nmol/L (ref <=527)
Triglycerides by NMR: 77 mg/dL (ref 0–149)

## 2014-10-23 LAB — CMP14+EGFR
ALT: 27 IU/L (ref 0–44)
AST: 26 IU/L (ref 0–40)
Albumin/Globulin Ratio: 2 (ref 1.1–2.5)
Albumin: 4.2 g/dL (ref 3.6–4.8)
Alkaline Phosphatase: 59 IU/L (ref 39–117)
BUN/Creatinine Ratio: 16 (ref 10–22)
BUN: 17 mg/dL (ref 8–27)
Bilirubin Total: 0.7 mg/dL (ref 0.0–1.2)
CO2: 24 mmol/L (ref 18–29)
Calcium: 9.3 mg/dL (ref 8.6–10.2)
Chloride: 101 mmol/L (ref 97–108)
Creatinine, Ser: 1.05 mg/dL (ref 0.76–1.27)
GFR calc Af Amer: 89 mL/min/{1.73_m2} (ref >59)
GFR calc non Af Amer: 77 mL/min/{1.73_m2} (ref >59)
Globulin, Total: 2.1 g/dL (ref 1.5–4.5)
Glucose: 87 mg/dL (ref 65–99)
Potassium: 4.4 mmol/L (ref 3.5–5.2)
Sodium: 142 mmol/L (ref 134–144)
Total Protein: 6.3 g/dL (ref 6.0–8.5)

## 2014-10-23 LAB — PSA, TOTAL AND FREE
PSA, Free Pct: 29.3 %
PSA, Free: 0.44 ng/mL
Prostate Specific Ag, Serum: 1.5 ng/mL (ref 0.0–4.0)

## 2014-11-30 ENCOUNTER — Other Ambulatory Visit: Payer: Self-pay | Admitting: Nurse Practitioner

## 2014-12-12 ENCOUNTER — Other Ambulatory Visit: Payer: Self-pay | Admitting: Nurse Practitioner

## 2014-12-12 MED ORDER — TADALAFIL 20 MG PO TABS: 10.0000 mg | ORAL_TABLET | ORAL | Status: DC | PRN

## 2015-02-04 ENCOUNTER — Other Ambulatory Visit: Payer: Self-pay | Admitting: Nurse Practitioner

## 2015-03-20 ENCOUNTER — Other Ambulatory Visit: Payer: Self-pay | Admitting: Nurse Practitioner

## 2015-04-03 ENCOUNTER — Telehealth: Payer: Self-pay

## 2015-04-03 NOTE — Telephone Encounter (Signed)
Pt came by the office and dropped off a paper he received from his insurance.  Starting in January 2017- his delzicol will not be covered anymore. His preferred will be: apriso, lialda, pentasa, belsalazide, sulfasalazine.

## 2015-04-11 ENCOUNTER — Other Ambulatory Visit: Payer: Self-pay | Admitting: Gastroenterology

## 2015-06-01 MED ORDER — MESALAMINE 1.2 G PO TBEC: 4.8000 g | DELAYED_RELEASE_TABLET | Freq: Every day | ORAL | Status: DC

## 2015-06-01 NOTE — Telephone Encounter (Signed)
Pt is aware. Kyle Sexton will you make him an appointment

## 2015-06-01 NOTE — Telephone Encounter (Signed)
RX for Lialda sent. Please make patient aware. Start once runs out of Delzicol.  OV with RMR only 07/2015 per RMR

## 2015-06-02 NOTE — Telephone Encounter (Signed)
PUT ON RECALL

## 2015-06-12 ENCOUNTER — Telehealth: Payer: Self-pay | Admitting: Internal Medicine

## 2015-06-12 NOTE — Telephone Encounter (Signed)
[place] PATIENT ABOUT PRESCRIPTION LSL PUT HIM ON, HIS INSURANCE SIS NOT COVER AND IT WAS 1000.00  HE WILL NEED TO TRY SOMETHING ELSE

## 2015-06-12 NOTE — Telephone Encounter (Signed)
See other phone note. Medication was lialda. It should be on his preferred list.

## 2015-06-12 NOTE — Telephone Encounter (Signed)
Lialda was sent in on 1/9.

## 2015-06-15 NOTE — Telephone Encounter (Signed)
Kyle Sexton, pt is requesting something different. He cannot afford Lialda.

## 2015-06-16 MED ORDER — MESALAMINE ER 0.375 G PO CP24: 1.5000 g | ORAL_CAPSULE | Freq: Every day | ORAL | Status: DC

## 2015-06-16 NOTE — Telephone Encounter (Signed)
Will send in [place]. I have a feeling all of those "preferred" ones will be the same copay. What's his insurance? Is he able to get a copay card?

## 2015-06-16 NOTE — Addendum Note (Signed)
Addended by: Orvil Feil on: 06/16/2015 11:10 AM   Modules accepted: Orders

## 2015-07-08 ENCOUNTER — Encounter: Payer: Self-pay | Admitting: Internal Medicine

## 2015-08-03 ENCOUNTER — Telehealth: Payer: Self-pay | Admitting: Internal Medicine

## 2015-08-03 NOTE — Telephone Encounter (Signed)
May be side effect but could be unrelated. Agree with OV with RMR only. I'm not sure if it would help but he can take all 4 tablets at the same time. That is the way it is recommended by the manufacturer.  I would try taking all 4 at bedtime to see if that helps the nausea.

## 2015-08-03 NOTE — Telephone Encounter (Signed)
MADE APPOINTMENT AND CALLED PATIENT WITH DATE AND TIME

## 2015-08-03 NOTE — Telephone Encounter (Signed)
Pt is aware.  

## 2015-08-03 NOTE — Telephone Encounter (Signed)
Spoke with the pt- he said he started the apriso 2 in the AM and 2 at bedtime about a month and a half ago. He said his colitis was 100% under control but he feels like he is having some side effects of the medication. He has some nausea that occurs at mealtime and a lack of energy. He has looked up the side effects and these are listed. He was wondering if there is anything we can do or if maybe reducing his dosage would help? Please advise.  Also, Erline Levine, pt is due for an ov with RMR this month. Please schedule.

## 2015-08-03 NOTE — Telephone Encounter (Signed)
Pt called asking to leave a message for RMR or his nurse. He has some questions about his medications. I offered to take a message or transfer to nurse's VM. He asked to be transferred to VM

## 2015-08-18 ENCOUNTER — Ambulatory Visit (INDEPENDENT_AMBULATORY_CARE_PROVIDER_SITE_OTHER): Payer: Managed Care, Other (non HMO) | Admitting: Internal Medicine

## 2015-08-18 ENCOUNTER — Encounter: Payer: Self-pay | Admitting: Internal Medicine

## 2015-08-18 VITALS — BP 109/68 | HR 70 | Temp 98.2°F | Ht 76.0 in | Wt 220.4 lb

## 2015-08-18 DIAGNOSIS — K529 Noninfective gastroenteritis and colitis, unspecified: Secondary | ICD-10-CM

## 2015-08-18 DIAGNOSIS — K5120 Ulcerative (chronic) proctitis without complications: Secondary | ICD-10-CM

## 2015-08-18 DIAGNOSIS — D126 Benign neoplasm of colon, unspecified: Secondary | ICD-10-CM

## 2015-08-18 NOTE — Progress Notes (Signed)
Primary Care Physician:  Chevis Pretty, FNP Primary Gastroenterologist:  Dr. Gala Romney  Pre-Procedure History & Physical: HPI:  Kyle Sexton is a 60 y.o. male here for follow-up left sided proctocolitis. Insurance dictated that we switch from [place] a call to a preset. He's taking for a pre-so each morning. His colitis symptoms have totally resolved. He had some transient nausea which is also resolved. No abdominal pain diarrhea tenesmus and rectal bleeding has 1-2 bowel movements daily. They are formed. History of colonic adenoma. Due for surveillance examination for that 4 years from now. No recent labs creatinine normal last year.  Past Medical History  Diagnosis Date  . Hyperlipidemia   . Ulcerative proctocolitis without complication [facility])     Past Surgical History  Procedure Laterality Date  . Knee surgery      both  . Colonoscopy  2007    Dr. Gala Romney: diminutive rectal polyp and 2X2 cm complex, carpet-like polyp, removed via piecemeal fashion, with remaining tissue ablated with APC. fragments of tubular adenoma  . Colonoscopy N/A 11/06/2012    QPR:FFMB sided proctocolitis. Colonic diverticulosis    Prior to Admission medications   Medication Sig Start Date End Date Taking? Authorizing Provider  acetaminophen (TYLENOL) 500 MG tablet Take 500 mg by mouth every 6 (six) hours as needed for pain.   Yes Historical Provider, MD  citalopram (CELEXA) 20 MG tablet TAKE 1 TABLET DAILY 03/20/15  Yes Mary-Margaret Hassell Done, FNP  EPINEPHrine (EPIPEN) 0.3 mg/0.3 mL SOAJ injection Inject 0.3 mLs (0.3 mg total) into the muscle once. 12/26/12  Yes Mary-Margaret Hassell Done, FNP  mesalamine (APRISO) 0.375 g 24 hr capsule Take 4 capsules (1.5 g total) by mouth daily. 06/16/15  Yes Orvil Feil, NP  tadalafil (CIALIS) 20 MG tablet Take 0.5-1 tablets (10-20 mg total) by mouth every other day as needed for erectile dysfunction. 12/12/14  Yes Mary-Margaret Hassell Done, FNP  ALPRAZolam Duanne Moron) 0.5 MG tablet TAKE 1  TABLET TWICE A DAY Patient not taking: Reported on 08/18/2015 10/06/14   Mary-Margaret Hassell Done, FNP    Allergies as of 08/18/2015 - Review Complete 08/18/2015  Allergen Reaction Noted  . Bee venom Anaphylaxis 10/30/2012    Family History  Problem Relation Age of Onset  . Crohn's disease Son     Social History   Social History  . Marital Status: Married    Spouse Name: N/A  . Number of Children: N/A  . Years of Education: N/A   Occupational History  . Not on file.   Social History Main Topics  . Smoking status: Former Research scientist (life sciences)  . Smokeless tobacco: Current User    Types: Chew  . Alcohol Use: No  . Drug Use: No  . Sexual Activity: Not on file   Other Topics Concern  . Not on file   Social History Narrative    Review of Systems: See HPI, otherwise negative ROS  Physical Exam: BP 109/68 mmHg  Pulse 70  Temp(Src) 98.2 F (36.8 C)  Ht 6' 4"  (1.93 m)  Wt 220 lb 6.4 oz (99.973 kg)  BMI 26.84 kg/m2 General:   Alert,  Well-developed, well-nourished, pleasant and cooperative in NAD Skin:  Intact without significant lesions or rashes. Eyes:  Sclera clear, no icterus.   Conjunctiva pink. Ears:  Normal auditory acuity. Nose:  No deformity, discharge,  or lesions. Mouth:  No deformity or lesions. Neck:  Supple; no masses or thyromegaly. No significant cervical adenopathy. Lungs:  Clear throughout to auscultation.   No wheezes, crackles,  or rhonchi. No acute distress. Heart:  Regular rate and rhythm; no murmurs, clicks, rubs,  or gallops. Abdomen: Non-distended, normal bowel sounds.  Soft and nontender without appreciable mass or hepatosplenomegaly.  Pulses:  Normal pulses noted. Extremities:  Without clubbing or edema.  Impression:    60 year old gentleman left-sided proctocolitis, history colonic adenoma. Doing very well now on a new mesalamine preparation, a present. Testing for periodic assessment of renal function. He is going to [place] [facility] later  this year for physical and lab labs done at that time.  Recommendation:  Continue Apriso daily - no change in dose  Please have [facility] send over labs when done  Office visit in 1 year  Surveillance colonoscopy (colonic adenoma) about 4 years from  Call if any interim problems arise    Notice: This dictation was prepared with Dragon dictation along with smaller phrase technology. Any transcriptional errors that result from this process are unintentional and may not be corrected upon review.

## 2015-08-18 NOTE — Patient Instructions (Signed)
Continue Apriso daily  Please have [facility] send over labs when done  Office visit in 1 year  Call if any problems arise

## 2015-10-23 ENCOUNTER — Ambulatory Visit (INDEPENDENT_AMBULATORY_CARE_PROVIDER_SITE_OTHER): Payer: Managed Care, Other (non HMO)

## 2015-10-23 ENCOUNTER — Ambulatory Visit (INDEPENDENT_AMBULATORY_CARE_PROVIDER_SITE_OTHER): Payer: Managed Care, Other (non HMO) | Admitting: Nurse Practitioner

## 2015-10-23 ENCOUNTER — Encounter: Payer: Self-pay | Admitting: Nurse Practitioner

## 2015-10-23 VITALS — BP 105/67 | HR 67 | Temp 96.7°F | Ht 76.0 in | Wt 219.0 lb

## 2015-10-23 DIAGNOSIS — Z0000 Encounter for general adult medical examination without abnormal findings: Secondary | ICD-10-CM | POA: Diagnosis not present

## 2015-10-23 DIAGNOSIS — F411 Generalized anxiety disorder: Secondary | ICD-10-CM

## 2015-10-23 DIAGNOSIS — E785 Hyperlipidemia, unspecified: Secondary | ICD-10-CM | POA: Diagnosis not present

## 2015-10-23 DIAGNOSIS — Z87438 Personal history of other diseases of male genital organs: Secondary | ICD-10-CM

## 2015-10-23 DIAGNOSIS — N529 Male erectile dysfunction, unspecified: Secondary | ICD-10-CM

## 2015-10-23 NOTE — Progress Notes (Signed)
 Subjective:    Patient ID: Vasilios K Jelinski, male    DOB: 02/21/1956, 60 y.o.   MRN: [id number]   Patient here today for annual physical exam and follow up of chronic medical problems.  Outpatient Encounter Prescriptions as of 10/23/2015  Medication Sig  . acetaminophen (TYLENOL) 500 MG tablet Take 500 mg by mouth every 6 (six) hours as needed for pain.  . EPINEPHrine (EPIPEN) 0.3 mg/0.3 mL SOAJ injection Inject 0.3 mLs (0.3 mg total) into the muscle once.  . mesalamine (APRISO) 0.375 g 24 hr capsule Take 4 capsules (1.5 g total) by mouth daily.  . tadalafil (CIALIS) 20 MG tablet Take 0.5-1 tablets (10-20 mg total) by mouth every other day as needed for erectile dysfunction.            Hyperlipidemia This is a chronic problem. The problem is resistant. Recent lipid tests were reviewed and are variable. He has no history of diabetes, hypothyroidism or obesity. There are no known factors aggravating his hyperlipidemia. He is currently on no antihyperlipidemic treatment. The current treatment provides moderate improvement of lipids. There are no compliance problems.  Risk factors for coronary artery disease include dyslipidemia and male sex.  GAD Personal life has gotten better- very seldom has to take xanax and has stoppped taking celexa all together. ED Has had in past but is not having much problems with it now.    Review of Systems  Constitutional: Negative.   HENT: Negative.   Eyes: Negative.   Respiratory: Negative.   Cardiovascular: Negative.   Gastrointestinal: Negative.   Genitourinary: Negative.   Neurological: Negative.   Psychiatric/Behavioral: Negative.   All other systems reviewed and are negative.      Objective:   Physical Exam  Constitutional: He is oriented to person, place, and time. He appears well-developed and well-nourished.  HENT:  Head: Normocephalic.  Right Ear: External ear normal.  Left Ear: External ear normal.  Nose: Nose normal.  Mouth/Throat:  Oropharynx is clear and moist.  Eyes: EOM are normal. Pupils are equal, round, and reactive to light.  Neck: Normal range of motion. Neck supple. No JVD present. No thyromegaly present.  Cardiovascular: Normal rate, regular rhythm, normal heart sounds and intact distal pulses.  Exam reveals no gallop and no friction rub.   No murmur heard. Multiple varicose veins bil lower ext  Pulmonary/Chest: Effort normal and breath sounds normal. No respiratory distress. He has no wheezes. He has no rales. He exhibits no tenderness.  Abdominal: Soft. Bowel sounds are normal. He exhibits no mass. There is no tenderness.  Genitourinary: Penis normal.  Musculoskeletal: Normal range of motion. He exhibits no edema.  Lymphadenopathy:    He has no cervical adenopathy.  Neurological: He is alert and oriented to person, place, and time. No cranial nerve deficit.  Skin: Skin is warm and dry.  Psychiatric: He has a normal mood and affect. His behavior is normal. Judgment and thought content normal.   BP 105/67 mmHg  Pulse 67  Temp(Src) 96.7 F (35.9 C) (Oral)  Ht 6' 4" (1.93 m)  Wt 219 lb (99.338 kg)  BMI 26.67 kg/m2   EKG- sinus bradycardia-Mary-Margaret , FNP  Chest x ray- mild chronic bronchitic changes-Preliminary reading by Mary , FNP  [facility]      Assessment & Plan:   1. Annual physical exam  2. Hyperlipidemia Low fat diet - CMP14+EGFR - Lipid panel - Hepatitis C antibody - DG Chest 2 View; Future - EKG 12-Lead  3.   GAD (generalized anxiety disorder) Stress management  4. Erectile dysfunction, unspecified erectile dysfunction type Will let me know if has problems  5. History of BPH - PSA, total and free    Labs pending Health maintenance reviewed Diet and exercise encouraged Continue all meds Follow up  In 6 months   Mary-Margaret , FNP    

## 2015-10-23 NOTE — Patient Instructions (Signed)

## 2015-10-24 LAB — CMP14+EGFR
ALT: 22 IU/L (ref 0–44)
AST: 23 IU/L (ref 0–40)
Albumin/Globulin Ratio: 1.9 (ref 1.2–2.2)
Albumin: 4.2 g/dL (ref 3.6–4.8)
Alkaline Phosphatase: 54 IU/L (ref 39–117)
BUN/Creatinine Ratio: 25 — ABNORMAL HIGH (ref 10–24)
BUN: 24 mg/dL (ref 8–27)
Bilirubin Total: 0.7 mg/dL (ref 0.0–1.2)
CO2: 23 mmol/L (ref 18–29)
Calcium: 9.2 mg/dL (ref 8.6–10.2)
Chloride: 101 mmol/L (ref 96–106)
Creatinine, Ser: 0.95 mg/dL (ref 0.76–1.27)
GFR calc Af Amer: 99 mL/min/1.73
GFR calc non Af Amer: 86 mL/min/1.73
Globulin, Total: 2.2 g/dL (ref 1.5–4.5)
Glucose: 98 mg/dL (ref 65–99)
Potassium: 4.4 mmol/L (ref 3.5–5.2)
Sodium: 141 mmol/L (ref 134–144)
Total Protein: 6.4 g/dL (ref 6.0–8.5)

## 2015-10-24 LAB — LIPID PANEL
Chol/HDL Ratio: 3.4 ratio units (ref 0.0–5.0)
Cholesterol, Total: 192 mg/dL (ref 100–199)
HDL: 56 mg/dL (ref >39)
LDL Calculated: 124 mg/dL — ABNORMAL HIGH (ref 0–99)
Triglycerides: 58 mg/dL (ref 0–149)
VLDL Cholesterol Cal: 12 mg/dL (ref 5–40)

## 2015-10-24 LAB — PSA, TOTAL AND FREE
PSA, Free Pct: 24.7 %
PSA, Free: 0.42 ng/mL
Prostate Specific Ag, Serum: 1.7 ng/mL (ref 0.0–4.0)

## 2015-10-24 LAB — HEPATITIS C ANTIBODY: Hep C Virus Ab: <0.1 s/co ratio (ref 0.0–0.9)

## 2015-12-18 ENCOUNTER — Other Ambulatory Visit: Payer: Self-pay | Admitting: Gastroenterology

## 2016-02-24 ENCOUNTER — Telehealth: Payer: Self-pay

## 2016-02-24 NOTE — Telephone Encounter (Signed)
Not enough info on which to make clinical decisions; he has UC; best practice  would be to get him in with extender this weekor first of next week

## 2016-02-24 NOTE — Telephone Encounter (Signed)
Pt is calling and he is having some rectal bleeding when he his having a bowel movement. No pain. Bowels are moving fine. He is just wanting to know what he needs to do for the bleeding.

## 2016-02-25 NOTE — Telephone Encounter (Signed)
Pt will be coming in Monday at 11:30 to see AB

## 2016-02-27 ENCOUNTER — Other Ambulatory Visit: Payer: Self-pay | Admitting: Nurse Practitioner

## 2016-02-29 ENCOUNTER — Other Ambulatory Visit (INDEPENDENT_AMBULATORY_CARE_PROVIDER_SITE_OTHER): Payer: Managed Care, Other (non HMO)

## 2016-02-29 ENCOUNTER — Encounter: Payer: Self-pay | Admitting: Gastroenterology

## 2016-02-29 ENCOUNTER — Ambulatory Visit (INDEPENDENT_AMBULATORY_CARE_PROVIDER_SITE_OTHER): Payer: Managed Care, Other (non HMO) | Admitting: Gastroenterology

## 2016-02-29 VITALS — BP 116/72 | HR 65 | Temp 97.8°F | Ht 76.0 in | Wt 223.6 lb

## 2016-02-29 DIAGNOSIS — K625 Hemorrhage of anus and rectum: Secondary | ICD-10-CM

## 2016-02-29 MED ORDER — BUDESONIDE 2 MG/ACT RE FOAM: 1.0000 | Freq: Two times a day (BID) | RECTAL | 1 refills | Status: DC

## 2016-02-29 NOTE — Progress Notes (Signed)
    Referring Provider: Chevis Pretty, * Primary Care Physician:  Chevis Pretty, FNP  Primary GI: Dr. Gala Romney   Chief Complaint  Patient presents with  . Rectal Bleeding    x 2 weeks, bright red, only w/BM    HPI:   Kyle Sexton is a 60 y.o. male presenting today with a history of left-sided proctocolitis, last colonoscopy 2014. History of adenoma, due for surveillance around 2021.   Apriso 4 capsules once a day. No diarrhea, no abdominal pain. BMs 1-2 per day. Only notes rectal bleeding with bowel movements. Only taking tylenol. Denies NSAIDs.   Past Medical History:  Diagnosis Date  . Hyperlipidemia   . Ulcerative proctocolitis without complication [facility])     Past Surgical History:  Procedure Laterality Date  . COLONOSCOPY  2007   Dr. Gala Romney: diminutive rectal polyp and 2X2 cm complex, carpet-like polyp, removed via piecemeal fashion, with remaining tissue ablated with APC. fragments of tubular adenoma  . COLONOSCOPY N/A 11/06/2012   JJK:KXFG sided proctocolitis. Colonic diverticulosis  . KNEE SURGERY     both    Current Outpatient Prescriptions  Medication Sig Dispense Refill  . acetaminophen (TYLENOL) 500 MG tablet Take 500 mg by mouth every 6 (six) hours as needed for pain.    . APRISO 0.375 g 24 hr capsule TAKE 4 CAPSULES (1.5 G TOTAL) BY MOUTH DAILY. 120 capsule 5  . tadalafil (CIALIS) 20 MG tablet Take 0.5-1 tablets (10-20 mg total) by mouth every other day as needed for erectile dysfunction. 9 tablet 11   No current facility-administered medications for this visit.     Allergies as of 02/29/2016 - Review Complete 02/29/2016  Allergen Reaction Noted  . Bee venom Anaphylaxis 10/30/2012    Family History  Problem Relation Age of Onset  . Crohn's disease Son     Social History   Social History  . Marital status: Married    Spouse name: N/A  . Number of children: N/A  . Years of education: N/A   Social History Main Topics  . Smoking status:  Former Research scientist (life sciences)  . Smokeless tobacco: Current User    Types: Chew  . Alcohol use No  . Drug use: No  . Sexual activity: Not Asked   Other Topics Concern  . None   Social History Narrative  . None    Review of Systems: As mentioned in HPI   Physical Exam: BP 116/72   Pulse 65   Temp 97.8 F (36.6 C) (Oral)   Ht 6' 4"  (1.93 m)   Wt 223 lb 9.6 oz (101.4 kg)   BMI 27.22 kg/m  General:   Alert and oriented. No distress noted. Pleasant and cooperative.  Head:  Normocephalic and atraumatic. Eyes:  Conjuctiva clear without scleral icterus. Mouth:  Oral mucosa pink and moist. Good dentition. No lesions. Abdomen:  +BS, soft, non-tender and non-distended. No rebound or guarding. No HSM or masses noted. Msk:  Symmetrical without gross deformities. Normal posture. Extremities:  Without edema. Neurologic:  Alert and  oriented x4;  grossly normal neurologically. Psych:  Alert and cooperative. Normal mood and affect.

## 2016-02-29 NOTE — Patient Instructions (Addendum)
Please have labs done at your PCP office.   I have sent the Uceris foam to the pharmacy in [place] for you.   Please call at the end of the course and let us know how you are doing.

## 2016-03-01 LAB — CBC
Hematocrit: 47.8 % (ref 37.5–51.0)
Hemoglobin: 16.4 g/dL (ref 12.6–17.7)
MCH: 31.6 pg (ref 26.6–33.0)
MCHC: 34.3 g/dL (ref 31.5–35.7)
MCV: 92 fL (ref 79–97)
Platelets: 225 10*3/uL (ref 150–379)
RBC: 5.19 x10E6/uL (ref 4.14–5.80)
RDW: 13.6 % (ref 12.3–15.4)
WBC: 7.7 10*3/uL (ref 3.4–10.8)

## 2016-03-01 LAB — C-REACTIVE PROTEIN: CRP: 0.4 mg/L (ref 0.0–4.9)

## 2016-03-01 LAB — SEDIMENTATION RATE: Sed Rate: 2 mm/hr (ref 0–30)

## 2016-03-04 NOTE — Assessment & Plan Note (Signed)
History of left-sided proctocolitis, on Apriso. No changes in bowel habits. Denies NSAIDs. No abdominal pain. Will trial Uceris foam for topical therapy to see if we can bring him back into remission. Check CBC, sed rate, CRP. If no improvement or persistent symptoms, may need to update colonoscopy. Further recommendations to follow.

## 2016-03-07 NOTE — Progress Notes (Signed)
cc'ed to pcp °

## 2016-03-14 NOTE — Progress Notes (Signed)
CBC normal. Sed rate and CRP normal. How is patient doing after course of Uceris foam?

## 2016-04-06 NOTE — Progress Notes (Signed)
Ok. Let's see him back in March as planned.

## 2016-06-16 ENCOUNTER — Other Ambulatory Visit: Payer: Self-pay | Admitting: Gastroenterology

## 2016-06-17 ENCOUNTER — Other Ambulatory Visit: Payer: Self-pay

## 2016-06-17 MED ORDER — MESALAMINE ER 0.375 G PO CP24: 1.5000 g | ORAL_CAPSULE | Freq: Every day | ORAL | 2 refills | Status: DC

## 2016-06-30 ENCOUNTER — Encounter: Payer: Self-pay | Admitting: Internal Medicine

## 2016-08-04 ENCOUNTER — Ambulatory Visit (INDEPENDENT_AMBULATORY_CARE_PROVIDER_SITE_OTHER): Payer: Managed Care, Other (non HMO) | Admitting: Nurse Practitioner

## 2016-08-04 ENCOUNTER — Encounter: Payer: Self-pay | Admitting: Nurse Practitioner

## 2016-08-04 VITALS — BP 105/67 | HR 69 | Temp 97.1°F | Ht 76.0 in | Wt 231.0 lb

## 2016-08-04 DIAGNOSIS — M62830 Muscle spasm of back: Secondary | ICD-10-CM | POA: Diagnosis not present

## 2016-08-04 MED ORDER — CYCLOBENZAPRINE HCL 10 MG PO TABS: 10.0000 mg | ORAL_TABLET | Freq: Three times a day (TID) | ORAL | 1 refills | Status: DC | PRN

## 2016-08-04 MED ORDER — NAPROXEN 500 MG PO TABS: 500.0000 mg | ORAL_TABLET | Freq: Two times a day (BID) | ORAL | 1 refills | Status: DC

## 2016-08-04 NOTE — Progress Notes (Signed)
   Subjective:    Patient ID: Kyle Sexton, male    DOB: 10-28-1955, 61 y.o.   MRN: [id number]  HPI: Patient arrives to the office with complaints of left sided thoracic pain beginning 1 day ago. The patient did yard work for 4 hours 2 days ago, took a long car ride yesterday, and pain began when getting out of the vehicle. Patient is unable to twist torso to left side without pain. He saw a chiropractor today for an adjustment and that has moderately improved his symptoms.    Review of Systems  Constitutional: Negative.   HENT: Negative.   Eyes: Negative.   Respiratory: Negative.  Negative for shortness of breath.   Cardiovascular: Negative.  Negative for chest pain.  Gastrointestinal: Negative.   Musculoskeletal: Positive for back pain.  Skin: Negative.   Neurological: Negative.  Negative for dizziness, weakness and numbness.  Psychiatric/Behavioral: Negative.   All other systems reviewed and are negative.       Objective:   Physical Exam  Constitutional: He is oriented to person, place, and time. He appears well-developed and well-nourished.  HENT:  Head: Normocephalic.  Right Ear: External ear normal.  Left Ear: External ear normal.  Eyes: Pupils are equal, round, and reactive to light.  Neck: Normal range of motion. Neck supple.  Cardiovascular: Normal rate, regular rhythm and normal heart sounds.   Pulmonary/Chest: Effort normal and breath sounds normal.  Abdominal: Soft. Bowel sounds are normal.  Genitourinary:  Genitourinary Comments: No CVA tenderness   Musculoskeletal: He exhibits tenderness.       Thoracic back: He exhibits tenderness and spasm. He exhibits no swelling.       Back:  Neurological: He is alert and oriented to person, place, and time. Coordination normal.  Skin: Skin is warm and dry.  Psychiatric: He has a normal mood and affect. His behavior is normal. Judgment and thought content normal.   BP 105/67   Pulse 69   Temp 97.1 F (36.2 C) (Oral)    Ht 6' 4"  (1.93 m)   Wt 231 lb (104.8 kg)   BMI 28.12 kg/m     Assessment & Plan:   1. Back muscle spasm Rest the muscle and apply heat as needed. Continue seeing chiropractor for adjustments.  - cyclobenzaprine (FLEXERIL) 10 MG tablet; Take 1 tablet (10 mg total) by mouth 3 (three) times daily as needed for muscle spasms (AS NEEDED FOR BACK SPASM).  Dispense: 30 tablet; Refill: 1 - naproxen (NAPROSYN) 500 MG tablet; Take 1 tablet (500 mg total) by mouth 2 (two) times daily with a meal.  Dispense: 60 tablet; Refill: [place], FNP student Chevis Pretty, FNP

## 2016-08-04 NOTE — Patient Instructions (Signed)
Thoracic Strain Thoracic strain is an injury to the muscles or tendons that attach to the upper back. A strain can be mild or severe. A mild strain may take only 1-2 weeks to heal. A severe strain involves torn muscles or tendons, so it may take 6-8 weeks to heal. Follow these instructions at home:  Rest as needed. Limit your activity as told by your doctor.  If directed, put ice on the injured area:  Put ice in a plastic bag.  Place a towel between your skin and the bag.  Leave the ice on for 20 minutes, 2-3 times per day.  Take over-the-counter and prescription medicines only as told by your doctor.  Begin doing exercises as told by your doctor or physical therapist.  Warm up before being active.  Bend your knees before you lift heavy objects.  Keep all follow-up visits as told by your doctor. This is important. Contact a doctor if:  Your pain is not helped by medicine.  Your pain, bruising, or swelling is getting worse.  You have a fever. Get help right away if:  You have shortness of breath.  You have chest pain.  You have weakness or loss of feeling (numbness) in your legs.  You cannot control when you pee (urinate). This information is not intended to replace advice given to you by your health care provider. Make sure you discuss any questions you have with your health care provider. Document Released: 10/26/2007 Document Revised: 01/09/2016 Document Reviewed: 07/03/2014 Elsevier Interactive Patient Education  2017 [employer].

## 2016-10-24 ENCOUNTER — Other Ambulatory Visit: Payer: Self-pay | Admitting: Nurse Practitioner

## 2016-11-21 ENCOUNTER — Telehealth: Payer: Self-pay | Admitting: Nurse Practitioner

## 2016-11-21 ENCOUNTER — Ambulatory Visit (INDEPENDENT_AMBULATORY_CARE_PROVIDER_SITE_OTHER): Payer: Managed Care, Other (non HMO) | Admitting: Nurse Practitioner

## 2016-11-21 ENCOUNTER — Encounter: Payer: Self-pay | Admitting: Nurse Practitioner

## 2016-11-21 VITALS — BP 103/70 | HR 85 | Temp 97.7°F | Ht 76.0 in | Wt 224.0 lb

## 2016-11-21 DIAGNOSIS — N400 Enlarged prostate without lower urinary tract symptoms: Secondary | ICD-10-CM

## 2016-11-21 DIAGNOSIS — H61892 Other specified disorders of left external ear: Secondary | ICD-10-CM

## 2016-11-21 DIAGNOSIS — E782 Mixed hyperlipidemia: Secondary | ICD-10-CM

## 2016-11-21 NOTE — Progress Notes (Signed)
   Subjective:    Patient ID: Kyle Sexton, male    DOB: 1955-11-26, 61 y.o.   MRN: [id number]  HPI  Patient was mowing yard and felt a bug fly into left ear canal.   Review of Systems  Constitutional: Negative.   HENT: Positive for ear pain (left).   Respiratory: Negative.   Cardiovascular: Negative.   Genitourinary: Negative.   Neurological: Negative.   Psychiatric/Behavioral: Negative.   All other systems reviewed and are negative.      Objective:   Physical Exam  Constitutional: He appears well-developed and well-nourished. No distress.  HENT:  Small bug visible anterior portion of left ear canal-   Cardiovascular: Normal rate and regular rhythm.   Pulmonary/Chest: Effort normal.  Neurological: He is alert.   BP 103/70   Pulse 85   Temp 97.7 F (36.5 C) (Oral)   Ht 6' 4"  (1.93 m)   Wt 224 lb (101.6 kg)   BMI 27.27 kg/m   Procedure- lidocaine instilled in ear canal- then drained- no bug visible after instill lidocaine. Ear irrigated with saline. No bug visible by myself or Assunta Found.       Assessment & Plan:  1. Foreign body sensation in left ear canal Flush with saline rto if sensation of bug reoccurs  Mary-Margaret Hassell Done, FNP

## 2016-11-21 NOTE — Telephone Encounter (Signed)
Orders put in for labs to have done early

## 2016-11-25 ENCOUNTER — Ambulatory Visit (INDEPENDENT_AMBULATORY_CARE_PROVIDER_SITE_OTHER): Payer: Managed Care, Other (non HMO) | Admitting: Gastroenterology

## 2016-11-25 ENCOUNTER — Encounter: Payer: Self-pay | Admitting: Gastroenterology

## 2016-11-25 VITALS — BP 119/70 | HR 64 | Temp 97.1°F | Ht 76.0 in | Wt 220.6 lb

## 2016-11-25 DIAGNOSIS — K51211 Ulcerative (chronic) proctitis with rectal bleeding: Secondary | ICD-10-CM | POA: Diagnosis not present

## 2016-11-25 NOTE — Progress Notes (Signed)
Primary Care Physician: Chevis Pretty, FNP  Primary Gastroenterologist:  Garfield Cornea, MD   Chief Complaint  Patient presents with  . Rectal Bleeding    f/u, doing ok    HPI: Kyle Sexton is a 61 y.o. male here for follow-up of left-sided proctocolitis, diagnosed June 2014 by colonoscopy. History of adenoma in the past therefore surveillance colonoscopy planned for June 2019. He is on Apriso 4 capsules once a day. We have seen back in October he was given Uceris foam for flare up.   Patient states that he's been doing very well. Back in May he decided he wanted to come off of Apriso to see how he can do. Several weeks after stopping his medication he started noticing a little bit of abdominal discomfort. He didn't fill as well. He therefore resumed his medication is now back to normal. Generally has 1-2 stools daily. No blood in the stool. No abdominal pain. Appetite is good. He feels well. Very active. He is due for his yearly blood work with his PCP on Monday. Currently not interested in a bone density study.  His biggest concern is his medication expense. He tells me that his pharmacist in [place] has found a coupon that reduces his expenses but he's having to get his medication filled weekly versus monthly. If he gets his medication filled weekly he generally spends around $80 a month, if he gets it filled monthly he generally spends around $250 a month. Going weekly to the pharmacy is a big inconvenience for him.  Denies upper GI symptoms.  Current Outpatient Prescriptions  Medication Sig Dispense Refill  . acetaminophen (TYLENOL) 500 MG tablet Take 500 mg by mouth every 6 (six) hours as needed for pain.    . APRISO 0.375 g 24 hr capsule TAKE 4 CAPSULES (1.5 G TOTAL) BY MOUTH DAILY. 120 capsule 2  . CIALIS 20 MG tablet TAKE 0.5-1 TABLETS (10-20 MG TOTAL) BY MOUTH EVERY OTHER DAY AS NEEDED FOR ERECTILE DYSFUNCTION. 9 tablet 0   No current facility-administered  medications for this visit.     Allergies as of 11/25/2016 - Review Complete 11/25/2016  Allergen Reaction Noted  . Bee venom Anaphylaxis 10/30/2012    ROS:  General: Negative for anorexia, weight loss, fever, chills, fatigue, weakness. ENT: Negative for hoarseness, difficulty swallowing , nasal congestion. CV: Negative for chest pain, angina, palpitations, dyspnea on exertion, peripheral edema.  Respiratory: Negative for dyspnea at rest, dyspnea on exertion, cough, sputum, wheezing.  GI: See history of present illness. GU:  Negative for dysuria, hematuria, urinary incontinence, urinary frequency, nocturnal urination.  Endo: Negative for unusual weight change.    Physical Examination:   BP 119/70   Pulse 64   Temp (!) 97.1 F (36.2 C) (Oral)   Ht 6' 4"  (1.93 m)   Wt 220 lb 9.6 oz (100.1 kg)   BMI 26.85 kg/m   General: Well-nourished, well-developed in no acute distress.  Eyes: No icterus. Mouth: Oropharyngeal mucosa moist and pink , no lesions erythema or exudate. Lungs: Clear to auscultation bilaterally.  Heart: Regular rate and rhythm, no murmurs rubs or gallops.  Abdomen: Bowel sounds are normal, nontender, nondistended, no hepatosplenomegaly or masses, no abdominal bruits or hernia , no rebound or guarding.   Extremities: No lower extremity edema. No clubbing or deformities. Neuro: Alert and oriented x 4   Skin: Warm and dry, no jaundice.   Psych: Alert and cooperative, normal mood and affect.  Labs:  Lab Results  Component Value Date   CREATININE 0.95 10/23/2015   BUN 24 10/23/2015   NA 141 10/23/2015   K 4.4 10/23/2015   CL 101 10/23/2015   CO2 23 10/23/2015   Lab Results  Component Value Date   ALT 22 10/23/2015   AST 23 10/23/2015   ALKPHOS 54 10/23/2015   BILITOT 0.7 10/23/2015   Lab Results  Component Value Date   WBC 7.7 02/29/2016   HGB 16.4 02/29/2016   HCT 47.8 02/29/2016   MCV 92 02/29/2016   PLT 225 02/29/2016    Imaging Studies: No  results found.

## 2016-11-25 NOTE — Assessment & Plan Note (Signed)
History of left-sided ulcerative proctocolitis diagnosed in 2014. Patient doing very well on current regimen. Issues with expense and having to get weekly refills to reduce his cost. We will look into this further for him. Return to the office in one year to see Dr. Gala Romney. At that time you will be due for surveillance colonoscopy for history of adenomatous colon polyps. I will follow-up on his blood work that is due next week to make sure his liver function and creatinine are normal. Patient is not interested in bone density study at this time. He will call in the interim with any problems.

## 2016-11-25 NOTE — Patient Instructions (Signed)
1. We will check into pricing for Apriso and get back with you. 2. Return to the office in one year to see Dr. Gala Romney. At that time you will be due for next colonoscopy for history of colon polyps. 3. Please read over FODMAP diet sheet. Foods in the high FODMAP category or more likely to produce gas and may be more beneficial to limit or avoid.

## 2016-11-28 ENCOUNTER — Encounter: Payer: Self-pay | Admitting: Nurse Practitioner

## 2016-11-28 ENCOUNTER — Ambulatory Visit (INDEPENDENT_AMBULATORY_CARE_PROVIDER_SITE_OTHER): Payer: Managed Care, Other (non HMO) | Admitting: Nurse Practitioner

## 2016-11-28 VITALS — BP 98/55 | HR 72 | Temp 96.7°F | Ht 76.0 in | Wt 221.0 lb

## 2016-11-28 DIAGNOSIS — K51211 Ulcerative (chronic) proctitis with rectal bleeding: Secondary | ICD-10-CM

## 2016-11-28 DIAGNOSIS — N400 Enlarged prostate without lower urinary tract symptoms: Secondary | ICD-10-CM | POA: Diagnosis not present

## 2016-11-28 DIAGNOSIS — E782 Mixed hyperlipidemia: Secondary | ICD-10-CM

## 2016-11-28 NOTE — Progress Notes (Signed)
cc'ed to pcp °

## 2016-11-28 NOTE — Addendum Note (Signed)
Addended by: Rolena Infante on: 11/28/2016 04:32 PM   Modules accepted: Orders

## 2016-11-28 NOTE — Patient Instructions (Signed)
Stress and Stress Management Stress is a normal reaction to life events. It is what you feel when life demands more than you are used to or more than you can handle. Some stress can be useful. For example, the stress reaction can help you catch the last bus of the day, study for a test, or meet a deadline at work. But stress that occurs too often or for too long can cause problems. It can affect your emotional health and interfere with relationships and normal daily activities. Too much stress can weaken your immune system and increase your risk for physical illness. If you already have a medical problem, stress can make it worse. What are the causes? All sorts of life events may cause stress. An event that causes stress for one person may not be stressful for another person. Major life events commonly cause stress. These may be positive or negative. Examples include losing your job, moving into a new home, getting married, having a baby, or losing a loved one. Less obvious life events may also cause stress, especially if they occur day after day or in combination. Examples include working long hours, driving in traffic, caring for children, being in debt, or being in a difficult relationship. What are the signs or symptoms? Stress may cause emotional symptoms including, the following:  Anxiety. This is feeling worried, afraid, on edge, overwhelmed, or out of control.  Anger. This is feeling irritated or impatient.  Depression. This is feeling sad, down, helpless, or guilty.  Difficulty focusing, remembering, or making decisions.  Stress may cause physical symptoms, including the following:  Aches and pains. These may affect your head, neck, back, stomach, or other areas of your body.  Tight muscles or clenched jaw.  Low energy or trouble sleeping.  Stress may cause unhealthy behaviors, including the following:  Eating to feel better (overeating) or skipping meals.  Sleeping too little,  too much, or both.  Working too much or putting off tasks (procrastination).  Smoking, drinking alcohol, or using drugs to feel better.  How is this diagnosed? Stress is diagnosed through an assessment by your health care provider. Your health care provider will ask questions about your symptoms and any stressful life events.Your health care provider will also ask about your medical history and may order blood tests or other tests. Certain medical conditions and medicine can cause physical symptoms similar to stress. Mental illness can cause emotional symptoms and unhealthy behaviors similar to stress. Your health care provider may refer you to a mental health professional for further evaluation. How is this treated? Stress management is the recommended treatment for stress.The goals of stress management are reducing stressful life events and coping with stress in healthy ways. Techniques for reducing stressful life events include the following:  Stress identification. Self-monitor for stress and identify what causes stress for you. These skills may help you to avoid some stressful events.  Time management. Set your priorities, keep a calendar of events, and learn to say "no." These tools can help you avoid making too many commitments.  Techniques for coping with stress include the following:  Rethinking the problem. Try to think realistically about stressful events rather than ignoring them or overreacting. Try to find the positives in a stressful situation rather than focusing on the negatives.  Exercise. Physical exercise can release both physical and emotional tension. The key is to find a form of exercise you enjoy and do it regularly.  Relaxation techniques. These relax the body and  mind. Examples include yoga, meditation, tai chi, biofeedback, deep breathing, progressive muscle relaxation, listening to music, being out in nature, journaling, and other hobbies. Again, the key is to find  one or more that you enjoy and can do regularly.  Healthy lifestyle. Eat a balanced diet, get plenty of sleep, and do not smoke. Avoid using alcohol or drugs to relax.  Strong support network. Spend time with family, friends, or other people you enjoy being around.Express your feelings and talk things over with someone you trust.  Counseling or talktherapy with a mental health professional may be helpful if you are having difficulty managing stress on your own. Medicine is typically not recommended for the treatment of stress.Talk to your health care provider if you think you need medicine for symptoms of stress. Follow these instructions at home:  Keep all follow-up visits as directed by your health care provider.  Take all medicines as directed by your health care provider. Contact a health care provider if:  Your symptoms get worse or you start having new symptoms.  You feel overwhelmed by your problems and can no longer manage them on your own. Get help right away if:  You feel like hurting yourself or someone else. This information is not intended to replace advice given to you by your health care provider. Make sure you discuss any questions you have with your health care provider. Document Released: 11/02/2000 Document Revised: 10/15/2015 Document Reviewed: 01/01/2013 Elsevier Interactive Patient Education  2017 [employer]  

## 2016-11-28 NOTE — Progress Notes (Signed)
   Subjective:    Patient ID: Kyle Sexton, male    DOB: January 17, 1956, 61 y.o.   MRN: [id number]  HPI  Audrick Lamoureaux Siegel is here today for follow up of chronic medical problem.  Outpatient Encounter Prescriptions as of 11/28/2016  Medication Sig  . acetaminophen (TYLENOL) 500 MG tablet Take 500 mg by mouth every 6 (six) hours as needed for pain.  . APRISO 0.375 g 24 hr capsule TAKE 4 CAPSULES (1.5 G TOTAL) BY MOUTH DAILY.  Marland Kitchen CIALIS 20 MG tablet TAKE 0.5-1 TABLETS (10-20 MG TOTAL) BY MOUTH EVERY OTHER DAY AS NEEDED FOR ERECTILE DYSFUNCTION.   No facility-administered encounter medications on file as of 11/28/2016.     1. Mixed hyperlipidemia  Patient tries to watch diet. Not much exercise  2. Ulcerative proctocolitis with rectal bleeding (HCC)  No recent flare ups. No blood in stools that he can see. Had follo wup with gi in last 30 days.  3. Benign prostatic hyperplasia without lower urinary tract symptoms  No recent problems voiding. No problem with stream    New complaints: None today     Review of Systems  Constitutional: Negative for activity change and appetite change.  HENT: Negative.   Eyes: Negative for pain.  Respiratory: Negative for shortness of breath.   Cardiovascular: Negative for chest pain, palpitations and leg swelling.  Gastrointestinal: Negative for abdominal pain.  Endocrine: Negative for polydipsia.  Genitourinary: Negative.   Skin: Negative for rash.  Neurological: Negative for dizziness, weakness and headaches.  Hematological: Does not bruise/bleed easily.  Psychiatric/Behavioral: Negative.   All other systems reviewed and are negative.      Objective:   Physical Exam  Constitutional: He is oriented to person, place, and time. He appears well-developed and well-nourished.  HENT:  Head: Normocephalic.  Right Ear: External ear normal.  Left Ear: External ear normal.  Nose: Nose normal.  Mouth/Throat: Oropharynx is clear and moist.  Eyes: EOM are  normal. Pupils are equal, round, and reactive to light.  Neck: Normal range of motion. Neck supple. No JVD present. No thyromegaly present.  Cardiovascular: Normal rate, regular rhythm, normal heart sounds and intact distal pulses.  Exam reveals no gallop and no friction rub.   No murmur heard. Pulmonary/Chest: Effort normal and breath sounds normal. No respiratory distress. He has no wheezes. He has no rales. He exhibits no tenderness.  Abdominal: Soft. Bowel sounds are normal. He exhibits no mass. There is no tenderness.  Musculoskeletal: Normal range of motion. He exhibits no edema.  Lymphadenopathy:    He has no cervical adenopathy.  Neurological: He is alert and oriented to person, place, and time. No cranial nerve deficit.  Skin: Skin is warm and dry.  Psychiatric: He has a normal mood and affect. His behavior is normal. Judgment and thought content normal.   BP (!) 98/55   Pulse 72   Temp (!) 96.7 F (35.9 C) (Oral)   Ht 6' 4"  (1.93 m)   Wt 221 lb (100.2 kg)   BMI 26.90 kg/m       Assessment & Plan:   1. Mixed hyperlipidemia   2. Ulcerative proctocolitis with rectal bleeding ([place])   3. Benign prostatic hyperplasia without lower urinary tract symptoms    Labs pending Diet and exercise encouraged Follow up in 1 year  [place], FNP

## 2016-11-29 LAB — CMP14+EGFR
ALT: 20 IU/L (ref 0–44)
AST: 22 IU/L (ref 0–40)
Albumin/Globulin Ratio: 1.9 (ref 1.2–2.2)
Albumin: 4.4 g/dL (ref 3.6–4.8)
Alkaline Phosphatase: 51 IU/L (ref 39–117)
BUN/Creatinine Ratio: 16 (ref 10–24)
BUN: 14 mg/dL (ref 8–27)
Bilirubin Total: 0.6 mg/dL (ref 0.0–1.2)
CO2: 23 mmol/L (ref 20–29)
Calcium: 9.1 mg/dL (ref 8.6–10.2)
Chloride: 105 mmol/L (ref 96–106)
Creatinine, Ser: 0.87 mg/dL (ref 0.76–1.27)
GFR calc Af Amer: 107 mL/min/{1.73_m2} (ref >59)
GFR calc non Af Amer: 92 mL/min/{1.73_m2} (ref >59)
Globulin, Total: 2.3 g/dL (ref 1.5–4.5)
Glucose: 93 mg/dL (ref 65–99)
Potassium: 4.4 mmol/L (ref 3.5–5.2)
Sodium: 142 mmol/L (ref 134–144)
Total Protein: 6.7 g/dL (ref 6.0–8.5)

## 2016-11-29 LAB — LIPID PANEL
Chol/HDL Ratio: 3.5 ratio (ref 0.0–5.0)
Cholesterol, Total: 177 mg/dL (ref 100–199)
HDL: 50 mg/dL (ref >39)
LDL Calculated: 110 mg/dL — ABNORMAL HIGH (ref 0–99)
Triglycerides: 84 mg/dL (ref 0–149)
VLDL Cholesterol Cal: 17 mg/dL (ref 5–40)

## 2016-11-29 LAB — PSA, TOTAL AND FREE
PSA, Free Pct: 23.3 %
PSA, Free: 0.35 ng/mL
Prostate Specific Ag, Serum: 1.5 ng/mL (ref 0.0–4.0)

## 2016-12-02 ENCOUNTER — Telehealth: Payer: Self-pay

## 2016-12-02 NOTE — Telephone Encounter (Signed)
I have tried to check pts formulary to see why his apriso is so expensive. There are multiple cigna formularies and am unable to determine which one is his. [facility] [place] [facility] from [facility] and to find out what is going on with Rx and how we can keep him from having to go to the pharmacy weekly. I have called, stayed on hold for over 15 minutes, called back, LMOM asking for information. They returned call and I was with a patient, I tried to call them back and was put on hold and no one returned to the call and I hung up.  Ginger, can you or Tretha Sciara follow up with the pharmacy about this next week?

## 2016-12-05 NOTE — Telephone Encounter (Signed)
My suggestion is to offer him an Apriso discount card and have him take it to his pharmacy and run it. Please let him know that his problem with cost is related to deductible needing to be met. If there are no significantly cheaper options I would not advise changing therapy.

## 2016-12-05 NOTE — Telephone Encounter (Signed)
I talked with the pharmacy and they don't have the information on which Rx plan he has and can not help [place]. I am going to try a call his insurance compancy for help

## 2016-12-05 NOTE — Telephone Encounter (Signed)
Talked with his insurance and they said that he has not meet his deductible for this year and he has a lot to go. Also they said that there was nothing cheaper on his formally list. They did say that Mesalamine 1.2 grams ext would be a little bit cheaper but not a lot. They would not tell prices because I was not the patient

## 2016-12-06 NOTE — Telephone Encounter (Signed)
Talked with patient and he understood everything. I am going to mail him a discount card to try.

## 2016-12-06 NOTE — Telephone Encounter (Signed)
Tried to call with no answer  

## 2016-12-18 NOTE — Progress Notes (Signed)
Creatinine and LFTs are normal.

## 2017-01-20 ENCOUNTER — Telehealth: Payer: Self-pay | Admitting: Internal Medicine

## 2017-01-20 ENCOUNTER — Other Ambulatory Visit: Payer: Self-pay | Admitting: Gastroenterology

## 2017-01-20 NOTE — Telephone Encounter (Signed)
PATIENT CALLED AND STATED THAT THE PHARMACY WAS SENDING A PRESCRIPTION  IN FOR HIS APRIZO AND HE NEEDS IT TO BE FILLED FOR THIS WEEKEND SINCE WE ARE NOT HERE Monday

## 2017-01-20 NOTE — Telephone Encounter (Signed)
It is in refill box and I will send reminder to Neil Crouch, PA and Walden Field, NP who are here today.

## 2017-01-20 NOTE — Telephone Encounter (Signed)
RX done

## 2017-06-01 ENCOUNTER — Telehealth: Payer: Self-pay

## 2017-06-01 NOTE — Telephone Encounter (Signed)
Noted, started working on forms, waiting on LSL signature.

## 2017-06-01 NOTE — Telephone Encounter (Signed)
Pt came by today to drop off patient assistance forms for apriso.  Per EG , review pt chart, ok to give him apriso samples. Reviewed chart. Pt was last seen in 11/2016 by LSL. Gave pt #3 boxes of apriso. Erline Levine gave paperwork to [place].

## 2017-06-02 ENCOUNTER — Telehealth: Payer: Self-pay

## 2017-06-02 NOTE — Telephone Encounter (Signed)
Noted. See note in pts chart opened 06/02/17.

## 2017-06-02 NOTE — Telephone Encounter (Signed)
Pt dropped off PT assistant paper yesterday 06/01/17. Spoke with pt this morning asking pt to bring a copy of his W2 forms from last year. Pt said he talked with the company and they didn't mention that to him. I explained the office protocol.  Pt would like papers faxed to [facility], papers will be faxed. Pt understands that the company may ask for statements for documentation purposes.

## 2017-08-21 NOTE — Telephone Encounter (Signed)
Pt Assistance for Apriso(mesalamine) capsule approved through 08/16/2018.

## 2017-08-21 NOTE — Telephone Encounter (Signed)
Lmom, pt notified of medication approval.

## 2017-09-14 ENCOUNTER — Telehealth: Payer: Self-pay | Admitting: Internal Medicine

## 2017-09-14 MED ORDER — MESALAMINE ER 0.375 G PO CP24: 1.5000 g | ORAL_CAPSULE | Freq: Every day | ORAL | 3 refills | Status: DC

## 2017-09-14 NOTE — Addendum Note (Signed)
Addended by: Mahala Menghini on: 09/14/2017 01:39 PM   Modules accepted: Orders

## 2017-09-14 NOTE — Telephone Encounter (Signed)
Routing message to [facility] refill. Pt will need an updated rx. Will fax med.

## 2017-09-14 NOTE — Telephone Encounter (Signed)
Faxed rx as directed. LMOM, pt is aware.

## 2017-09-14 NOTE — Telephone Encounter (Signed)
PATIENT CALLED AND SAID APRISO PRESCRIPTION NEEDS TO BE FAXED TO THIS NUMBER  [phone]([facility])  THEY NEED AN UPDATED PRESCRIPTION.  PATIENTS NUMBER IS  [phone]

## 2017-09-14 NOTE — Telephone Encounter (Signed)
Programmer, multimedia. Please fax.

## 2017-10-13 ENCOUNTER — Telehealth: Payer: Self-pay | Admitting: Internal Medicine

## 2017-10-13 NOTE — Telephone Encounter (Signed)
[phone] please call patient about his Kyle Sexton, it is too expensive and he wants to know if there is another route he can go.

## 2017-10-17 NOTE — Telephone Encounter (Signed)
Spoke with pt and the medication price has been resolved. Pt didn't have insurance and that expensive price was out of pocket coverage. Pts insurance is active now and the medication is $40.00 monthly now. Pt is pleased with the current price.

## 2017-10-26 ENCOUNTER — Encounter: Payer: Self-pay | Admitting: Internal Medicine

## 2018-01-27 ENCOUNTER — Other Ambulatory Visit: Payer: Self-pay | Admitting: Gastroenterology

## 2018-04-12 ENCOUNTER — Ambulatory Visit (INDEPENDENT_AMBULATORY_CARE_PROVIDER_SITE_OTHER): Payer: BLUE CROSS/BLUE SHIELD | Admitting: Nurse Practitioner

## 2018-04-12 ENCOUNTER — Encounter: Payer: Self-pay | Admitting: Nurse Practitioner

## 2018-04-12 VITALS — BP 111/63 | HR 66 | Temp 97.5°F | Ht 76.0 in | Wt 205.0 lb

## 2018-04-12 DIAGNOSIS — E782 Mixed hyperlipidemia: Secondary | ICD-10-CM

## 2018-04-12 DIAGNOSIS — Z0000 Encounter for general adult medical examination without abnormal findings: Secondary | ICD-10-CM | POA: Diagnosis not present

## 2018-04-12 MED ORDER — TADALAFIL 20 MG PO TABS: 10.0000 mg | ORAL_TABLET | ORAL | 2 refills | Status: DC | PRN

## 2018-04-12 NOTE — Addendum Note (Signed)
Addended by: Chevis Pretty on: 04/12/2018 03:58 PM   Modules accepted: Orders

## 2018-04-12 NOTE — Patient Instructions (Signed)

## 2018-04-12 NOTE — Progress Notes (Signed)
   Subjective:    Patient ID: Kyle Sexton, male    DOB: Jun 17, 1955, 62 y.o.   MRN: [id number]   Chief Complaint: medical management of chronic isues  HPI:  1. Annual physical exam   2. Mixed hyperlipidemia  Very conscienous of what he eats. 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 He has lost 20lbs since last visit    Outpatient Encounter Medications as of 04/12/2018  Medication Sig  . acetaminophen (TYLENOL) 500 MG tablet Take 500 mg by mouth every 6 (six) hours as needed for pain.  . APRISO 0.375 g 24 hr capsule TAKE 4 CAPSULES BY MOUTH EVERY DAY  . CIALIS 20 MG tablet TAKE 0.5-1 TABLETS (10-20 MG TOTAL) BY MOUTH EVERY OTHER DAY AS NEEDED FOR ERECTILE DYSFUNCTION.  . mesalamine (APRISO) 0.375 g 24 hr capsule Take 4 capsules (1.5 g total) by mouth daily.    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   Review of Systems  Constitutional: Negative for activity change and appetite change.  HENT: Negative.   Eyes: Negative for pain.  Respiratory: Negative for shortness of breath.   Cardiovascular: Negative for chest pain, palpitations and leg swelling.  Gastrointestinal: Negative for abdominal pain.  Endocrine: Negative for polydipsia.  Genitourinary: Negative.   Skin: Negative for rash.  Neurological: Negative for dizziness, weakness and headaches.  Hematological: Does not bruise/bleed easily.  Psychiatric/Behavioral: Negative.   All other systems reviewed and are negative.      Objective:   Physical Exam  Constitutional: He is oriented to person, place, and time. He appears well-developed and well-nourished.  HENT:  Head: Normocephalic.  Nose: Nose normal.  Mouth/Throat: Oropharynx is clear and moist.  Eyes: Pupils are equal, round, and reactive to light. EOM are normal.  Neck: Normal range of motion and phonation normal. Neck supple. No JVD present. Carotid bruit is not present. No thyroid mass and no  thyromegaly present.  Cardiovascular: Normal rate and regular rhythm.  Pulmonary/Chest: Effort normal and breath sounds normal. No respiratory distress.  Abdominal: Soft. Normal appearance, normal aorta and bowel sounds are normal. There is no tenderness.  Musculoskeletal: Normal range of motion.  Lymphadenopathy:    He has no cervical adenopathy.  Neurological: He is alert and oriented to person, place, and time.  Skin: Skin is warm and dry.  Psychiatric: He has a normal mood and affect. His behavior is normal. Judgment and thought content normal.  Nursing note and vitals reviewed.   BP 111/63   Pulse 66   Temp (!) 97.5 F (36.4 C) (Oral)   Ht 6' 4" (1.93 m)   Wt 205 lb (93 kg)   BMI 24.95 kg/m       Assessment & Plan:  Courvoisier Hamblen Rafuse comes in today with chief complaint of No chief complaint on file.   Diagnosis and orders addressed:  1. Annual physical exam - CMP14+EGFR - PSA, total and free  2. Mixed hyperlipidemia Low fat diet - Lipid panel   Labs pending Health Maintenance reviewed Diet and exercise encouraged  Follow up plan:  6 months   Mary-Margaret Hassell Done, FNP

## 2018-04-13 LAB — PSA, TOTAL AND FREE
PSA, Free Pct: 22.1 %
PSA, Free: 0.31 ng/mL
Prostate Specific Ag, Serum: 1.4 ng/mL (ref 0.0–4.0)

## 2018-04-13 LAB — CMP14+EGFR
ALT: 19 IU/L (ref 0–44)
AST: 26 IU/L (ref 0–40)
Albumin/Globulin Ratio: 2.3 — ABNORMAL HIGH (ref 1.2–2.2)
Albumin: 4.5 g/dL (ref 3.6–4.8)
Alkaline Phosphatase: 59 IU/L (ref 39–117)
BUN/Creatinine Ratio: 18 (ref 10–24)
BUN: 16 mg/dL (ref 8–27)
Bilirubin Total: 0.8 mg/dL (ref 0.0–1.2)
CO2: 26 mmol/L (ref 20–29)
Calcium: 9.4 mg/dL (ref 8.6–10.2)
Chloride: 102 mmol/L (ref 96–106)
Creatinine, Ser: 0.88 mg/dL (ref 0.76–1.27)
GFR calc Af Amer: 106 mL/min/{1.73_m2} (ref >59)
GFR calc non Af Amer: 91 mL/min/{1.73_m2} (ref >59)
Globulin, Total: 2 g/dL (ref 1.5–4.5)
Glucose: 96 mg/dL (ref 65–99)
Potassium: 3.9 mmol/L (ref 3.5–5.2)
Sodium: 143 mmol/L (ref 134–144)
Total Protein: 6.5 g/dL (ref 6.0–8.5)

## 2018-04-13 LAB — LIPID PANEL
Chol/HDL Ratio: 2.6 ratio (ref 0.0–5.0)
Cholesterol, Total: 166 mg/dL (ref 100–199)
HDL: 63 mg/dL (ref >39)
LDL Calculated: 90 mg/dL (ref 0–99)
Triglycerides: 66 mg/dL (ref 0–149)
VLDL Cholesterol Cal: 13 mg/dL (ref 5–40)

## 2018-04-17 ENCOUNTER — Ambulatory Visit: Payer: Self-pay

## 2018-04-17 ENCOUNTER — Other Ambulatory Visit: Payer: Self-pay | Admitting: Family Medicine

## 2018-04-17 DIAGNOSIS — M79641 Pain in right hand: Secondary | ICD-10-CM

## 2018-05-01 ENCOUNTER — Telehealth: Payer: Self-pay

## 2018-05-01 NOTE — Telephone Encounter (Signed)
PT called to get advise on taking a mall dose of ibuprofen for a broken bone in his hand and arthritis. He is taken Apriso for the ulcerative proctocolitis and understand the risk of bleeding. He is in some pain and tylenol isn't helping his hand. Is it ok to take ibuprofen while he is healing? Please advise.

## 2018-05-01 NOTE — Telephone Encounter (Signed)
Noted, pt notified and will d/c if needed. Pt is going to take a small amount of Ibuprofen.

## 2018-05-01 NOTE — Telephone Encounter (Addendum)
NSAIDs (like ibuprofen, aleve, mobic, etc) all can increase risk of UC flare. I would advise using small dose possibly if he has to have the ibuprofen (no more than 466m maybe twice per day). If has any signs of flare, he should stop NSAID.

## 2018-07-13 ENCOUNTER — Other Ambulatory Visit: Payer: Self-pay | Admitting: Nurse Practitioner

## 2018-07-13 MED ORDER — TADALAFIL 20 MG PO TABS: 10.0000 mg | ORAL_TABLET | ORAL | 2 refills | Status: DC | PRN

## 2018-07-22 ENCOUNTER — Other Ambulatory Visit: Payer: Self-pay | Admitting: Gastroenterology

## 2018-07-23 NOTE — Telephone Encounter (Signed)
Patient is overdue for office visit. Needs appt please. I have filled for one month with 1 refill.

## 2018-07-23 NOTE — Telephone Encounter (Signed)
Noted. Pt is aware that he needs and office visit. Pt didn't make an appointment today, he was going to look at his schedule and call back to schedule.

## 2018-10-11 ENCOUNTER — Ambulatory Visit: Payer: BLUE CROSS/BLUE SHIELD | Admitting: Nurse Practitioner

## 2018-12-10 ENCOUNTER — Telehealth: Payer: Self-pay

## 2018-12-10 NOTE — Telephone Encounter (Signed)
Pt has been taking Apriso for the ulcerative proctocolitis. The cost of the medication has gotten too expensive for pt. Pt said the pharmacy said he could try a Mesalamine drug. I will check with [employer] to see what will be less expensive for pt.

## 2018-12-11 NOTE — Telephone Encounter (Signed)
I called pt's insurance company, was placed on hold for 23 mins before ending the call. Will call back pts insurance company back.

## 2018-12-19 NOTE — Telephone Encounter (Signed)
Spoke with pts pharmacy yesterday and was placed on hold for 25 mins before the call dropped. No return phone call was given by rep. Will call pts insurance back.

## 2018-12-24 NOTE — Addendum Note (Signed)
Addended by: Mahala Menghini on: 12/24/2018 11:03 AM   Modules accepted: Orders

## 2018-12-24 NOTE — Telephone Encounter (Signed)
[facility] [place] [place] [facility]

## 2018-12-24 NOTE — Telephone Encounter (Signed)
LSL, is there another type of medication pt can try. I've tried calling pt's insurance company several and haven't gotten a response to what is covered for the pt. The pharmacy recommended a Mesalamine medication.

## 2018-12-24 NOTE — Telephone Encounter (Signed)
He was overdue for OV and TCS (letter sent 10/2017).   Please schedule OV.

## 2018-12-25 ENCOUNTER — Encounter: Payer: Self-pay | Admitting: Internal Medicine

## 2018-12-25 MED ORDER — MESALAMINE 1.2 G PO TBEC: 2.4000 g | DELAYED_RELEASE_TABLET | Freq: Every day | ORAL | 5 refills | Status: DC

## 2018-12-25 NOTE — Addendum Note (Signed)
Addended by: Mahala Menghini on: 12/25/2018 07:51 AM   Modules accepted: Orders

## 2018-12-25 NOTE — Telephone Encounter (Signed)
Patient scheduled and letter sent

## 2018-12-25 NOTE — Telephone Encounter (Signed)
Please schedule ov for pt, he is overdue for his TCS

## 2018-12-25 NOTE — Telephone Encounter (Signed)
RX done. Lialda 2.4 grams daily.  Please schedule OV, he is overdue and needs TCS.

## 2019-01-30 ENCOUNTER — Ambulatory Visit: Payer: Self-pay | Admitting: Gastroenterology

## 2019-03-06 ENCOUNTER — Ambulatory Visit (INDEPENDENT_AMBULATORY_CARE_PROVIDER_SITE_OTHER): Payer: BC Managed Care – PPO | Admitting: Gastroenterology

## 2019-03-06 ENCOUNTER — Encounter: Payer: Self-pay | Admitting: Gastroenterology

## 2019-03-06 ENCOUNTER — Other Ambulatory Visit: Payer: Self-pay

## 2019-03-06 ENCOUNTER — Telehealth: Payer: Self-pay | Admitting: Gastroenterology

## 2019-03-06 VITALS — BP 99/60 | HR 77 | Temp 97.6°F | Ht 76.0 in | Wt 196.0 lb

## 2019-03-06 DIAGNOSIS — K5120 Ulcerative (chronic) proctitis without complications: Secondary | ICD-10-CM | POA: Diagnosis not present

## 2019-03-06 MED ORDER — PEG 3350-KCL-NA BICARB-NACL 420 G PO SOLR: 4000.0000 mL | ORAL | 0 refills | Status: DC

## 2019-03-06 MED ORDER — MESALAMINE ER 500 MG PO CPCR: 1000.0000 mg | ORAL_CAPSULE | Freq: Four times a day (QID) | ORAL | 3 refills | Status: DC

## 2019-03-06 NOTE — Telephone Encounter (Signed)
Kyle Sexton, patient seen in office. We went back and forth about whether to restart UC med before or after his TCS.  He could not afford Apriso. I sent in [place] in 11/2018 but he said he never heard of anything from pharmacy. I have sent in the oldest generic mesalamine option available which looks like level 1 preferred but still may require P.A.  PLEASE LET PT KNOW THAT I'M DOING THIS NOW TO SEE WHAT WILL BE COVERED AND AFFORDABLE. IF WE GET A GOOD OPTION, HE CAN START BACK ON MEDS NOW OR WAIT UNTIL AFTER TCS. WHICHEVER HE PREFERS.

## 2019-03-06 NOTE — Assessment & Plan Note (Addendum)
History of left-sided ulcerative proctocolitis diagnosed in 2014.  Patient has been off of Apriso for several months due to expense.  Having issues trying to find medication is covered well.  No flares in several years.  Also with history of tubular adenoma x2007 colonoscopy.  Due for colonoscopy at this time for surveillance purposes.  Patient is concerned about expensive colonoscopy.  Discussed options of conscious sedation versus propofol anesthesia, declines anesthesiology services due to expense.  Previously did well with conscious sedation.  I have discussed the risks, alternatives, benefits with regards to but not limited to the risk of reaction to medication, bleeding, infection, perforation and the patient is agreeable to proceed. Written consent to be obtained.  We discussed pursuing mesalamine treatment for UC treatment and for benefits of CRC reduction in setting of UC. He wants to wait until after colonoscopy to decide. He is not opposed to treatment, but cost has been prohibitive more recently.   Will send in mesalamine 1017m po qid (appears to be cheapest alternative for him) to see what cost will be.

## 2019-03-06 NOTE — Telephone Encounter (Signed)
Noted. Pt notified of LSL's recommendations. Pt is aware that his pharmacy will have to notify our office if the medication isn't covered. PA may be required.

## 2019-03-06 NOTE — Progress Notes (Signed)
Primary Care Physician:  Chevis Pretty, FNP  Primary Gastroenterologist:  Garfield Cornea, MD   Chief Complaint  Patient presents with  . Ulcerative Colitis    no medication for 2-3 months, too expensive through insurance, generic was expensive, no problems as of right now, due for TCS    HPI:  Kyle Sexton is a 63 y.o. male here for follow-up.  He was last seen in July 2018.  He has a history of left-sided proctocolitis, diagnosed in June 2014 by colonoscopy.  History of adenoma in the past therefore surveillance colonoscopy was planned in 2019.  In the past he was on Apriso.  He has had lots of issues with medication expense, coverage.  Back in July 2020 Apriso became too expensive, pharmacy recommended trying mesalamine preparation.  Lialda 2.4 g daily prescribed. He states he never received the medication.   No flare in years. Like apriso but could no longer afford.  Bowel movements are regular.  No melena or rectal bleeding.  Flareup of hemorrhoids a couple of times since we last saw him.  No upper GI symptoms.  Change in job almost 2 years ago.  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  He is lost about 20 pounds.    Current Outpatient Medications  Medication Sig Dispense Refill  . acetaminophen (TYLENOL) 500 MG tablet Take 500 mg by mouth every 6 (six) hours as needed for pain.    Marland Kitchen ibuprofen (ADVIL) 200 MG tablet Take 200 mg by mouth every 6 (six) hours as needed.    . tadalafil (CIALIS) 20 MG tablet Take 0.5-1 tablets (10-20 mg total) by mouth every other day as needed for erectile dysfunction. 9 tablet 2  .       No current facility-administered medications for this visit.     Allergies as of 03/06/2019 - Review Complete 03/06/2019  Allergen Reaction Noted  . Bee venom Anaphylaxis 10/30/2012    Past Medical History:  Diagnosis Date  . Hyperlipidemia   . Ulcerative proctocolitis without complication [facility])     Past Surgical History:  Procedure Laterality Date   . COLONOSCOPY  2007   Dr. Gala Romney: diminutive rectal polyp and 2X2 cm complex, carpet-like polyp, removed via piecemeal fashion, with remaining tissue ablated with APC. fragments of tubular adenoma  . COLONOSCOPY N/A 11/06/2012   LKJ:ZPHX sided proctocolitis. Colonic diverticulosis  . KNEE SURGERY     both    Family History  Problem Relation Age of Onset  . Crohn's disease Son     Social History   Socioeconomic History  . Marital status: Married    Spouse name: Not on file  . Number of children: Not on file  . Years of education: Not on file  . Highest education level: Not on file  Occupational History  . Not on file  Social Needs  . Financial resource strain: Not on file  . Food insecurity    Worry: Not on file    Inability: Not on file  . Transportation needs    Medical: Not on file    Non-medical: Not on file  Tobacco Use  . Smoking status: Former Research scientist (life sciences)  . Smokeless tobacco: Current User    Types: Chew  Substance and Sexual Activity  . Alcohol use: Yes    Comment: 2 beers on occasion  . Drug use: No  . Sexual activity: Not on file  Lifestyle  . Physical activity    Days per week: Not on file  Minutes per session: Not on file  . Stress: Not on file  Relationships  . Social Herbalist on phone: Not on file    Gets together: Not on file    Attends religious service: Not on file    Active member of club or organization: Not on file    Attends meetings of clubs or organizations: Not on file    Relationship status: Not on file  . Intimate partner violence    Fear of current or ex partner: Not on file    Emotionally abused: Not on file    Physically abused: Not on file    Forced sexual activity: Not on file  Other Topics Concern  . Not on file  Social History Narrative  . Not on file      ROS:  General: Negative for anorexia, weight loss, fever, chills, fatigue, weakness. Eyes: Negative for vision changes.  ENT: Negative for hoarseness,  difficulty swallowing , nasal congestion. CV: Negative for chest pain, angina, palpitations, dyspnea on exertion, peripheral edema.  Respiratory: Negative for dyspnea at rest, dyspnea on exertion, cough, sputum, wheezing.  GI: See history of present illness. GU:  Negative for dysuria, hematuria, urinary incontinence, urinary frequency, nocturnal urination.  MS: Negative for joint pain, low back pain.  Derm: Negative for rash or itching.  Neuro: Negative for weakness, abnormal sensation, seizure, frequent headaches, memory loss, confusion.  Psych: Negative for anxiety, depression, suicidal ideation, hallucinations.  Endo: Negative for unusual weight change.  Heme: Negative for bruising or bleeding. Allergy: Negative for rash or hives.    Physical Examination:  BP 99/60   Pulse 77   Temp 97.6 F (36.4 C) (Oral)   Ht 6' 4"  (1.93 m)   Wt 196 lb (88.9 kg)   BMI 23.86 kg/m    General: Well-nourished, well-developed in no acute distress.  Head: Normocephalic, atraumatic.   Eyes: Conjunctiva pink, no icterus. Mouth: Oropharyngeal mucosa moist and pink , no lesions erythema or exudate. Neck: Supple without thyromegaly, masses, or lymphadenopathy.  Lungs: Clear to auscultation bilaterally.  Heart: Regular rate and rhythm, no murmurs rubs or gallops.  Abdomen: Bowel sounds are normal, nontender, nondistended, no hepatosplenomegaly or masses, no abdominal bruits or    hernia , no rebound or guarding.   Rectal: not performed Extremities: No lower extremity edema. No clubbing or deformities.  Neuro: Alert and oriented x 4 , grossly normal neurologically.  Skin: Warm and dry, no rash or jaundice.   Psych: Alert and cooperative, normal mood and affect.  Labs: Lab Results  Component Value Date   CREATININE 0.88 04/12/2018   BUN 16 04/12/2018   NA 143 04/12/2018   K 3.9 04/12/2018   CL 102 04/12/2018   CO2 26 04/12/2018   Lab Results  Component Value Date   ALT 19 04/12/2018   AST  26 04/12/2018   ALKPHOS 59 04/12/2018   BILITOT 0.8 04/12/2018    Imaging Studies: No results found.

## 2019-03-06 NOTE — Patient Instructions (Signed)
1. Colonoscopy as scheduled. See separate instructions.  

## 2019-03-07 NOTE — Progress Notes (Signed)
cc'ed to pcp °

## 2019-03-15 ENCOUNTER — Telehealth: Payer: Self-pay

## 2019-03-15 NOTE — Telephone Encounter (Signed)
PT left Vm that he is [facility] a [facility] [place]. I called and left VM for him to have them fax [place] info if we can do a PA for it.

## 2019-03-18 NOTE — Telephone Encounter (Signed)
PA for Pentasa was completed through [email or web address] and has been approved. Left a message on pt's phone. Pt is aware of approval and asked to call back if needed.

## 2019-04-09 ENCOUNTER — Telehealth: Payer: Self-pay

## 2019-04-09 NOTE — Telephone Encounter (Signed)
VM, received, medication is $1,000 that LSL sent in. Pt would like a return call at [phone].

## 2019-04-11 NOTE — Telephone Encounter (Signed)
Spoke with pt. Will check with his insurance company to see what medication is covered.

## 2019-04-29 ENCOUNTER — Telehealth: Payer: Self-pay | Admitting: Internal Medicine

## 2019-04-29 NOTE — Telephone Encounter (Signed)
Patient l/m that he wanted to cancel his covid test and his procedure

## 2019-04-29 NOTE — Telephone Encounter (Signed)
Noted. Looks like he never got back in touch with [place] regarding insurance coverage for mesalamine for UC.   Elmo Putt, see [phone] telephone note. Can we reach out to patient?

## 2019-04-29 NOTE — Telephone Encounter (Signed)
Called pt, he doesn't want to reschedule procedure at this time. States he is going out of town and will get back with [place] if he decides to reschedule. LMOVM for endo scheduler. COVID test cancelled.  FYI to LSL.

## 2019-04-29 NOTE — Telephone Encounter (Signed)
LSL, pt is going to call me back when he can talk. Pts insurance company said the medication isn't eligible for a tier reduction. Medication is covered under pts plan. Several PA's have been done and it's approved. I discussed pt assistance with the pt. He will call me back when he is able to talk. I'm not sure pt assistance will cover the medication. Insurance company didn't offer any other medications.

## 2019-05-02 NOTE — Telephone Encounter (Signed)
Routing to [place]. Please schedule apt with RMR only for 4 months.

## 2019-05-02 NOTE — Telephone Encounter (Signed)
Noted. Await patient call.  Make ov with Dr. Gala Romney only in four months.

## 2019-05-06 NOTE — Telephone Encounter (Signed)
On recall  °

## 2019-05-26 DIAGNOSIS — Z20828 Contact with and (suspected) exposure to other viral communicable diseases: Secondary | ICD-10-CM | POA: Diagnosis not present

## 2019-05-26 DIAGNOSIS — Z03818 Encounter for observation for suspected exposure to other biological agents ruled out: Secondary | ICD-10-CM | POA: Diagnosis not present

## 2019-06-06 ENCOUNTER — Other Ambulatory Visit (HOSPITAL_COMMUNITY): Payer: BC Managed Care – PPO

## 2019-06-07 ENCOUNTER — Ambulatory Visit (HOSPITAL_COMMUNITY): Admit: 2019-06-07 | Payer: BC Managed Care – PPO | Admitting: Internal Medicine

## 2019-06-07 ENCOUNTER — Encounter (HOSPITAL_COMMUNITY): Payer: Self-pay

## 2019-06-07 SURGERY — COLONOSCOPY
Anesthesia: Moderate Sedation

## 2019-08-05 ENCOUNTER — Encounter: Payer: Self-pay | Admitting: Internal Medicine

## 2019-12-04 ENCOUNTER — Encounter: Payer: Self-pay | Admitting: Nurse Practitioner

## 2019-12-04 ENCOUNTER — Ambulatory Visit (INDEPENDENT_AMBULATORY_CARE_PROVIDER_SITE_OTHER): Payer: BC Managed Care – PPO

## 2019-12-04 ENCOUNTER — Other Ambulatory Visit: Payer: Self-pay

## 2019-12-04 ENCOUNTER — Ambulatory Visit (INDEPENDENT_AMBULATORY_CARE_PROVIDER_SITE_OTHER): Payer: BC Managed Care – PPO | Admitting: Nurse Practitioner

## 2019-12-04 VITALS — BP 111/67 | HR 64 | Temp 98.6°F | Resp 20 | Ht 76.0 in | Wt 199.0 lb

## 2019-12-04 DIAGNOSIS — Z0001 Encounter for general adult medical examination with abnormal findings: Secondary | ICD-10-CM | POA: Diagnosis not present

## 2019-12-04 DIAGNOSIS — K5120 Ulcerative (chronic) proctitis without complications: Secondary | ICD-10-CM

## 2019-12-04 DIAGNOSIS — E782 Mixed hyperlipidemia: Secondary | ICD-10-CM | POA: Diagnosis not present

## 2019-12-04 DIAGNOSIS — Z0000 Encounter for general adult medical examination without abnormal findings: Secondary | ICD-10-CM

## 2019-12-04 DIAGNOSIS — Z139 Encounter for screening, unspecified: Secondary | ICD-10-CM | POA: Diagnosis not present

## 2019-12-04 DIAGNOSIS — N400 Enlarged prostate without lower urinary tract symptoms: Secondary | ICD-10-CM | POA: Diagnosis not present

## 2019-12-04 DIAGNOSIS — K625 Hemorrhage of anus and rectum: Secondary | ICD-10-CM

## 2019-12-04 NOTE — Addendum Note (Signed)
Addended by: Chevis Pretty on: 12/04/2019 03:42 PM   Modules accepted: Orders

## 2019-12-04 NOTE — Patient Instructions (Signed)
Exercising to Stay Healthy To become healthy and stay healthy, it is recommended that you do moderate-intensity and vigorous-intensity exercise. You can tell that you are exercising at a moderate intensity if your heart starts beating faster and you start breathing faster but can still hold a conversation. You can tell that you are exercising at a vigorous intensity if you are breathing much harder and faster and cannot hold a conversation while exercising. Exercising regularly is important. It has many health benefits, such as:  Improving overall fitness, flexibility, and endurance.  Increasing bone density.  Helping with weight control.  Decreasing body fat.  Increasing muscle strength.  Reducing stress and tension.  Improving overall health. How often should I exercise? Choose an activity that you enjoy, and set realistic goals. Your health care provider can help you make an activity plan that works for you. Exercise regularly as told by your health care provider. This may include:  Doing strength training two times a week, such as: ? Lifting weights. ? Using resistance bands. ? Push-ups. ? Sit-ups. ? Yoga.  Doing a certain intensity of exercise for a given amount of time. Choose from these options: ? A total of 150 minutes of moderate-intensity exercise every week. ? A total of 75 minutes of vigorous-intensity exercise every week. ? A mix of moderate-intensity and vigorous-intensity exercise every week. Children, pregnant women, people who have not exercised regularly, people who are overweight, and older adults may need to talk with a health care provider about what activities are safe to do. If you have a medical condition, be sure to talk with your health care provider before you start a new exercise program. What are some exercise ideas? Moderate-intensity exercise ideas include:  Walking 1 mile (1.6 km) in about 15  minutes.  Biking.  Hiking.  Golfing.  Dancing.  Water aerobics. Vigorous-intensity exercise ideas include:  Walking 4.5 miles (7.2 km) or more in about 1 hour.  Jogging or running 5 miles (8 km) in about 1 hour.  Biking 10 miles (16.1 km) or more in about 1 hour.  Lap swimming.  Roller-skating or in-line skating.  Cross-country skiing.  Vigorous competitive sports, such as football, basketball, and soccer.  Jumping rope.  Aerobic dancing. What are some everyday activities that can help me to get exercise?  Yard work, such as: ? Pushing a lawn mower. ? Raking and bagging leaves.  Washing your car.  Pushing a stroller.  Shoveling snow.  Gardening.  Washing windows or floors. How can I be more active in my day-to-day activities?  Use stairs instead of an elevator.  Take a walk during your lunch break.  If you drive, park your car farther away from your work or school.  If you take public transportation, get off one stop early and walk the rest of the way.  Stand up or walk around during all of your indoor phone calls.  Get up, stretch, and walk around every 30 minutes throughout the day.  Enjoy exercise with a friend. Support to continue exercising will help you keep a regular routine of activity. What guidelines can I follow while exercising?  Before you start a new exercise program, talk with your health care provider.  Do not exercise so much that you hurt yourself, feel dizzy, or get very short of breath.  Wear comfortable clothes and wear shoes with good support.  Drink plenty of water while you exercise to prevent dehydration or heat stroke.  Work out until your breathing   and your heartbeat get faster. Where to find more information  U.S. Department of Health and Human Services: www.hhs.gov  Centers for Disease Control and Prevention (CDC): www.cdc.gov Summary  Exercising regularly is important. It will improve your overall fitness,  flexibility, and endurance.  Regular exercise also will improve your overall health. It can help you control your weight, reduce stress, and improve your bone density.  Do not exercise so much that you hurt yourself, feel dizzy, or get very short of breath.  Before you start a new exercise program, talk with your health care provider. This information is not intended to replace advice given to you by your health care provider. Make sure you discuss any questions you have with your health care provider. Document Revised: 04/21/2017 Document Reviewed: 03/30/2017 Elsevier Patient Education  2020 Elsevier Inc.  

## 2019-12-04 NOTE — Progress Notes (Addendum)
Subjective:    Patient ID: Kyle Sexton, male    DOB: Aug 19, 1955, 64 y.o.   MRN: [id number]   Chief Complaint: Annual Exam    HPI:  1. Annual physical exam  2. Mixed hyperlipidemia Does watch diet and does stay very active.  Lab Results  Component Value Date   CHOL 166 04/12/2018   HDL 63 04/12/2018   LDLCALC 90 04/12/2018   TRIG 66 04/12/2018   CHOLHDL 2.6 04/12/2018   The 10-year ASCVD risk score [name], et al., 2013) is: 7.7%   Values used to calculate the score:     Age: 26 years     Sex: Male     Is Non-Hispanic African American: No     Diabetic: No     Tobacco smoker: No     Systolic Blood Pressure: 728 mmHg     Is BP treated: No     HDL Cholesterol: 63 mg/dL     Total Cholesterol: 166 mg/dL   3. Benign prostatic hyperplasia without lower urinary tract symptoms Is doing well and has no problems voiding   4. Ulcerative proctocolitis without complication (HCC) No recent flare ups. He is currently on no meds  5. Rectal bleeding None lately    Outpatient Encounter Medications as of 12/04/2019  Medication Sig  . tadalafil (CIALIS) 20 MG tablet Take 0.5-1 tablets (10-20 mg total) by mouth every other day as needed for erectile dysfunction.  Marland Kitchen acetaminophen (TYLENOL) 500 MG tablet Take 500 mg by mouth every 6 (six) hours as needed for pain. (Patient not taking: Reported on 12/04/2019)  . ibuprofen (ADVIL) 200 MG tablet Take 200 mg by mouth every 6 (six) hours as needed. (Patient not taking: Reported on 12/04/2019)  . [DISCONTINUED] mesalamine (PENTASA) 500 MG CR capsule Take 2 capsules (1,000 mg total) by mouth 4 (four) times daily.    Past Surgical History:  Procedure Laterality Date  . COLONOSCOPY  2007   Dr. Gala Romney: diminutive rectal polyp and 2X2 cm complex, carpet-like polyp, removed via piecemeal fashion, with remaining tissue ablated with APC. fragments of tubular adenoma  . COLONOSCOPY N/A 11/06/2012   [facility]U:ORVI sided proctocolitis. Colonic  diverticulosis  . KNEE SURGERY     both    Family History  Problem Relation Age of Onset  . Crohn's disease Son   . Diabetes Mother     New complaints: None today  Social history: Lives by hisself  Controlled substance contract: n/a    Review of Systems  Constitutional: Negative for diaphoresis.  Eyes: Negative for pain.  Respiratory: Negative for shortness of breath.   Cardiovascular: Negative for chest pain, palpitations and leg swelling.  Gastrointestinal: Negative for abdominal pain.  Endocrine: Negative for polydipsia.  Skin: Negative for rash.  Neurological: Negative for dizziness, weakness and headaches.  Hematological: Does not bruise/bleed easily.  All other systems reviewed and are negative.      Objective:   Physical Exam Vitals and nursing note reviewed.  Constitutional:      Appearance: Normal appearance. He is well-developed.  HENT:     Head: Normocephalic.     Nose: Nose normal.  Eyes:     Pupils: Pupils are equal, round, and reactive to light.  Neck:     Thyroid: No thyroid mass or thyromegaly.     Vascular: No carotid bruit or JVD.     Trachea: Phonation normal.  Cardiovascular:     Rate and Rhythm: Normal rate and regular rhythm.  Pulmonary:  Effort: Pulmonary effort is normal. No respiratory distress.     Breath sounds: Normal breath sounds.  Abdominal:     General: Bowel sounds are normal.     Palpations: Abdomen is soft.     Tenderness: There is no abdominal tenderness.  Musculoskeletal:        General: Normal range of motion.     Cervical back: Normal range of motion and neck supple.  Lymphadenopathy:     Cervical: No cervical adenopathy.  Skin:    General: Skin is warm and dry.  Neurological:     Mental Status: He is alert and oriented to person, place, and time.  Psychiatric:        Behavior: Behavior normal.        Thought Content: Thought content normal.        Judgment: Judgment normal.     BP 111/67   Pulse 64    Temp 98.6 F (37 C) (Temporal)   Resp 20   Ht _0  (1.93 m)   Wt 199 lb (90.3 kg)   SpO2 95%   BMI 24.22 kg/m   chest xray- no cardiopulmonary abnormalities-Preliminary reading by Ronnald Collum, FNP  [facility]  EKG- NSR-Mary-Margaret Hassell Done, FNP       Assessment & Plan:  [name] comes in today with chief complaint of Annual Exam   Diagnosis and orders addressed:  1. Annual physical exam - CBC with Differential/Platelet - CMP14+EGFR  2. Mixed hyperlipidemia Low fat diet - Lipid panel  3. Benign prostatic hyperplasia without lower urinary tract symptoms Follow up with urology - PSA, total and free  4. Ulcerative proctocolitis without complication ([place]) Watch diet to prevent flare ups  5. Rectal bleeding Stool softners   Labs pending Health Maintenance reviewed Diet and exercise encouraged  Follow up plan: 1 year   [place], FNP

## 2019-12-04 NOTE — Progress Notes (Deleted)
Subjective:    Patient ID: Kyle Sexton, male    DOB: [birth year], 64 y.o.   MRN: [id number]   Chief Complaint: Annual Exam    HPI:  1. Mixed hyperlipidemia *** Lab Results  Component Value Date   CHOL 166 04/12/2018   HDL 63 04/12/2018   LDLCALC 90 04/12/2018   TRIG 66 04/12/2018   CHOLHDL 2.6 04/12/2018     2. Benign prostatic hyperplasia without lower urinary tract symptoms ***  3. Ulcerative proctocolitis without complication (HCC) ***  4. Rectal bleeding ***    Outpatient Encounter Medications as of 12/04/2019  Medication Sig  . tadalafil (CIALIS) 20 MG tablet Take 0.5-1 tablets (10-20 mg total) by mouth every other day as needed for erectile dysfunction.  Marland Kitchen acetaminophen (TYLENOL) 500 MG tablet Take 500 mg by mouth every 6 (six) hours as needed for pain. (Patient not taking: Reported on 12/04/2019)  . ibuprofen (ADVIL) 200 MG tablet Take 200 mg by mouth every 6 (six) hours as needed. (Patient not taking: Reported on 12/04/2019)  . [DISCONTINUED] mesalamine (PENTASA) 500 MG CR capsule Take 2 capsules (1,000 mg total) by mouth 4 (four) times daily.  . [DISCONTINUED] polyethylene glycol-electrolytes (TRILYTE) 420 g solution Take 4,000 mLs by mouth as directed.   No facility-administered encounter medications on file as of 12/04/2019.    Past Surgical History:  Procedure Laterality Date  . COLONOSCOPY  2007   Dr. Gala Romney: diminutive rectal polyp and 2X2 cm complex, carpet-like polyp, removed via piecemeal fashion, with remaining tissue ablated with APC. fragments of tubular adenoma  . COLONOSCOPY N/A 11/06/2012   [facility]:OBSJ sided proctocolitis. Colonic diverticulosis  . KNEE SURGERY     both    Family History  Problem Relation Age of Onset  . Crohn's disease Son   . Diabetes Mother     New complaints: ***  Social history: ***  Controlled substance contract: n/a     Review of Systems  Constitutional: Negative.   HENT: Negative.   Eyes: Negative.     Respiratory: Negative.   Cardiovascular: Negative.   Gastrointestinal: Negative.   Endocrine: Negative.   Genitourinary: Negative.   Musculoskeletal: Negative.   Allergic/Immunologic: Negative.   Neurological: Negative.   Hematological: Negative.   Psychiatric/Behavioral: Negative.        Objective:   Physical Exam Vitals reviewed.  Constitutional:      Appearance: Normal appearance. He is normal weight.  HENT:     Head: Normocephalic and atraumatic.     Right Ear: Tympanic membrane, ear canal and external ear normal.     Left Ear: Tympanic membrane, ear canal and external ear normal.     Nose: Nose normal.     Mouth/Throat:     Mouth: Mucous membranes are moist.     Pharynx: Oropharynx is clear.  Eyes:     Extraocular Movements: Extraocular movements intact.     Conjunctiva/sclera: Conjunctivae normal.     Pupils: Pupils are equal, round, and reactive to light.  Cardiovascular:     Rate and Rhythm: Normal rate and regular rhythm.     Pulses: Normal pulses.     Heart sounds: Normal heart sounds.  Pulmonary:     Effort: Pulmonary effort is normal.     Breath sounds: Normal breath sounds.  Abdominal:     General: Abdomen is flat. Bowel sounds are normal.     Palpations: Abdomen is soft.  Genitourinary:    Penis: Normal.      Testes: Normal.  Prostate: Normal.     Rectum: Normal.  Musculoskeletal:        General: Normal range of motion.     Cervical back: Normal range of motion and neck supple.  Skin:    General: Skin is warm and dry.     Capillary Refill: Capillary refill takes less than 2 seconds.  Neurological:     General: No focal deficit present.     Mental Status: He is alert and oriented to person, place, and time. Mental status is at baseline.  Psychiatric:        Mood and Affect: Mood normal.        Behavior: Behavior normal.        Thought Content: Thought content normal.        Judgment: Judgment normal.     BP 111/67   Pulse 64   Temp  98.6 F (37 C) (Temporal)   Resp 20   Ht 6' 4"  (1.93 m)   Wt 199 lb (90.3 kg)   SpO2 95%   BMI 24.22 kg/m       Assessment & Plan:  Kyle Sexton comes in today with chief complaint of Annual Exam   Diagnosis and orders addressed:  1. Mixed hyperlipidemia Patient encouraged to follow a low-fat diet and carb-modified diet. Exercise as tolerated.   2. Benign prostatic hyperplasia without lower urinary tract symptoms Patient encouraged to report new or worsening urinary symptoms.   3. Ulcerative proctocolitis without complication [place]) Patient encouraged to follow-up with a GI doctor regularly and check stools for bright red bleeding and melena.   4. Rectal bleeding Check stools for melena. Report feelings of dizziness.    Labs pending Health Maintenance reviewed Diet and exercise encouraged  Follow up plan: Follow-up in 6 months    Mary-Margaret Hassell Done, [place], [place] Student

## 2019-12-05 LAB — CBC WITH DIFFERENTIAL/PLATELET
Basophils Absolute: 0.1 10*3/uL (ref 0.0–0.2)
Basos: 1 %
EOS (ABSOLUTE): 0.1 10*3/uL (ref 0.0–0.4)
Eos: 1 %
Hematocrit: 43.4 % (ref 37.5–51.0)
Hemoglobin: 15.2 g/dL (ref 13.0–17.7)
Immature Grans (Abs): 0 10*3/uL (ref 0.0–0.1)
Immature Granulocytes: 0 %
Lymphocytes Absolute: 2.1 10*3/uL (ref 0.7–3.1)
Lymphs: 24 %
MCH: 31.5 pg (ref 26.6–33.0)
MCHC: 35 g/dL (ref 31.5–35.7)
MCV: 90 fL (ref 79–97)
Monocytes Absolute: 0.9 10*3/uL (ref 0.1–0.9)
Monocytes: 10 %
Neutrophils Absolute: 5.5 10*3/uL (ref 1.4–7.0)
Neutrophils: 64 %
Platelets: 229 10*3/uL (ref 150–450)
RBC: 4.83 x10E6/uL (ref 4.14–5.80)
RDW: 12.6 % (ref 11.6–15.4)
WBC: 8.6 10*3/uL (ref 3.4–10.8)

## 2019-12-05 LAB — CMP14+EGFR
ALT: 21 IU/L (ref 0–44)
AST: 23 IU/L (ref 0–40)
Albumin/Globulin Ratio: 2.2 (ref 1.2–2.2)
Albumin: 4.4 g/dL (ref 3.8–4.8)
Alkaline Phosphatase: 64 IU/L (ref 48–121)
BUN/Creatinine Ratio: 16 (ref 10–24)
BUN: 16 mg/dL (ref 8–27)
Bilirubin Total: 0.5 mg/dL (ref 0.0–1.2)
CO2: 22 mmol/L (ref 20–29)
Calcium: 9.5 mg/dL (ref 8.6–10.2)
Chloride: 107 mmol/L — ABNORMAL HIGH (ref 96–106)
Creatinine, Ser: 0.97 mg/dL (ref 0.76–1.27)
GFR calc Af Amer: 94 mL/min/{1.73_m2} (ref >59)
GFR calc non Af Amer: 82 mL/min/{1.73_m2} (ref >59)
Globulin, Total: 2 g/dL (ref 1.5–4.5)
Glucose: 84 mg/dL (ref 65–99)
Potassium: 4.1 mmol/L (ref 3.5–5.2)
Sodium: 142 mmol/L (ref 134–144)
Total Protein: 6.4 g/dL (ref 6.0–8.5)

## 2019-12-05 LAB — PSA, TOTAL AND FREE
PSA, Free Pct: 28.7 %
PSA, Free: 0.43 ng/mL
Prostate Specific Ag, Serum: 1.5 ng/mL (ref 0.0–4.0)

## 2019-12-05 LAB — LIPID PANEL
Chol/HDL Ratio: 3 ratio (ref 0.0–5.0)
Cholesterol, Total: 178 mg/dL (ref 100–199)
HDL: 60 mg/dL (ref >39)
LDL Chol Calc (NIH): 101 mg/dL — ABNORMAL HIGH (ref 0–99)
Triglycerides: 95 mg/dL (ref 0–149)
VLDL Cholesterol Cal: 17 mg/dL (ref 5–40)

## 2019-12-10 ENCOUNTER — Other Ambulatory Visit: Payer: Self-pay | Admitting: Nurse Practitioner

## 2019-12-10 MED ORDER — TADALAFIL 20 MG PO TABS: 10.0000 mg | ORAL_TABLET | ORAL | 2 refills | Status: DC | PRN

## 2019-12-10 NOTE — Telephone Encounter (Signed)
°  Prescription Request  12/10/2019  What is the name of the medication or equipment? tadalafil (CIALIS) 20 MG tablet    Have you contacted your pharmacy to request a refill? (if applicable) yes  Which pharmacy would you like this sent to? [facility] [place] pt had CPE with MMM on 12/04/2019   Patient notified that their request is being sent to the clinical staff for review and that they should receive a response within 2 business days.

## 2019-12-27 ENCOUNTER — Telehealth: Payer: Self-pay | Admitting: Nurse Practitioner

## 2019-12-27 MED ORDER — CICLOPIROX 8 % EX SOLN: Freq: Every day | CUTANEOUS | 0 refills | Status: DC

## 2019-12-27 NOTE — Telephone Encounter (Signed)
Pt requesting medication to be called in for a nail fungus. He states he is unsure of the name but the pharmacist said he needs the one that has to be ordered by a provider instead of OTC[facility] [place] He states it is a paint on product.

## 2019-12-27 NOTE — Telephone Encounter (Signed)
[place] rx sent to pharmacy

## 2019-12-30 NOTE — Telephone Encounter (Signed)
Pt aware and has already picked up the medication.

## 2020-04-10 ENCOUNTER — Telehealth: Payer: Self-pay

## 2020-04-10 ENCOUNTER — Other Ambulatory Visit: Payer: Self-pay | Admitting: Nurse Practitioner

## 2020-04-10 MED ORDER — CYCLOBENZAPRINE HCL 5 MG PO TABS
5.0000 mg | ORAL_TABLET | Freq: Three times a day (TID) | ORAL | 0 refills | Status: DC | PRN
Start: 2020-04-10 — End: 2021-05-14

## 2020-04-10 NOTE — Telephone Encounter (Signed)
Flexeril prescription send to pharmacy

## 2020-04-10 NOTE — Telephone Encounter (Signed)
Patient states that he has back problems and hasn't had a problem in 3 years. He had a flare up this week, saw a chiropractor yesterday and is supposed to follow up again with them on Monday. Today he woke up in terrible pain and is currently using a tens unit but worried about getting through the weekend. Would like something such as a muscle relaxer. Please advise

## 2020-04-10 NOTE — Telephone Encounter (Signed)
Patient aware.

## 2020-08-21 ENCOUNTER — Ambulatory Visit (INDEPENDENT_AMBULATORY_CARE_PROVIDER_SITE_OTHER): Payer: BC Managed Care – PPO | Admitting: Nurse Practitioner

## 2020-08-21 ENCOUNTER — Encounter: Payer: Self-pay | Admitting: Nurse Practitioner

## 2020-08-21 DIAGNOSIS — K5120 Ulcerative (chronic) proctitis without complications: Secondary | ICD-10-CM | POA: Diagnosis not present

## 2020-08-21 DIAGNOSIS — M19049 Primary osteoarthritis, unspecified hand: Secondary | ICD-10-CM

## 2020-08-21 MED ORDER — CELECOXIB 200 MG PO CAPS: 200.0000 mg | ORAL_CAPSULE | Freq: Two times a day (BID) | ORAL | 2 refills | Status: DC

## 2020-08-21 MED ORDER — PREDNISONE 20 MG PO TABS: 40.0000 mg | ORAL_TABLET | Freq: Every day | ORAL | 0 refills | Status: AC

## 2020-08-21 NOTE — Progress Notes (Signed)
   Virtual Visit  Note Due to COVID-19 pandemic this visit was conducted virtually. This visit type was conducted due to national recommendations for restrictions regarding the COVID-19 Pandemic (e.g. social distancing, sheltering in place) in an effort to limit this patient's exposure and mitigate transmission in our community. All issues noted in this document were discussed and addressed.  A physical exam was not performed with this format.  I connected with Kyle Sexton on 08/21/20 at *3:15** by telephone and verified that I am speaking with the correct person using two identifiers. Kyle Sexton is currently located at home and no one is currently with him during visit. The provider, Mary-Margaret Hassell Done, FNP is located in their office at time of visit.  I discussed the limitations, risks, security and privacy concerns of performing an evaluation and management service by telephone and the availability of in person appointments. I also discussed with the patient that there may be a patient responsible charge related to this service. The patient expressed understanding and agreed to proceed.   History and Present Illness:   Chief Complaint: Arthritis   HPI Patient c/o arthritis pain in bil hands, started about 3-4 months ago and is getting worse. They swell and hurt and cannot make a fist. 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 He does take tylenol. Has history of ulcerative colitis and cannot take NSAIDS.  * time to repeat colonoscopy  Review of Systems  Constitutional: Negative.   Respiratory: Negative.   Cardiovascular: Negative.   Musculoskeletal: Positive for joint pain (hand pain).     Observations/Objective: Alert and oriented- answers all questions appropriately No distress Describes hands as swelling and cant make a full fist   Assessment and Plan: Kyle Sexton in today with chief complaint of Arthritis   1. Ulcerative proctocolitis without  complication ([place]) referral [place] - Ambulatory referral to Gastroenterology  2. Hand arthritis Moist heat to hands Soak in epsom salt - predniSONE (DELTASONE) 20 MG tablet; Take 2 tablets (40 mg total) by mouth daily with breakfast for 5 days. 2 po daily for 5 days  Dispense: 10 tablet; Refill: 0 - celecoxib (CELEBREX) 200 MG capsule; Take 1 capsule (200 mg total) by mouth 2 (two) times daily.  Dispense: 60 capsule; Refill: 2    Follow Up Instructions: prn     I discussed the assessment and treatment plan with the patient. The patient was provided an opportunity to ask questions and all were answered. The patient agreed with the plan and demonstrated an understanding of the instructions.   The patient was advised to call back or seek an in-person evaluation if the symptoms worsen or if the condition fails to improve as anticipated.  The above assessment and management plan was discussed with the patient. The patient verbalized understanding of and has agreed to the management plan. Patient is aware to call the clinic if symptoms persist or worsen. Patient is aware when to return to the clinic for a follow-up visit. Patient educated on when it is appropriate to go to the emergency department.   Time call ended:  3:30  I provided 15 minutes of  non face-to-face time during this encounter.    Mary-Margaret Hassell Done, FNP

## 2020-09-17 ENCOUNTER — Telehealth: Payer: Self-pay | Admitting: Internal Medicine

## 2020-09-17 MED ORDER — BUDESONIDE ER 9 MG PO TB24: 9.0000 mg | ORAL_TABLET | Freq: Every day | ORAL | 1 refills | Status: DC

## 2020-09-17 NOTE — Addendum Note (Signed)
Addended by: Mahala Menghini on: 09/17/2020 02:55 PM   Modules accepted: Orders

## 2020-09-17 NOTE — Telephone Encounter (Signed)
Spoke with pt. Pt is aware of recommendations of Neil Crouch, PA. Magda Paganini, can you please send RX to [facility] in [place]. It's in pts chart. [facility] and with the GoodRx card I sent pt, he can get medication for $30-37.

## 2020-09-17 NOTE — Telephone Encounter (Signed)
Spoke with pt. Pt is having a colitis flare. Pt has had diarrhea for several weeks. Pt is having 12 loose bowel movements daily. Pt reports no pain in his abdomen and hasn't taken any medication for his flares in 2 years. Pt hasn't been seen since 2020 and scheduled pt with Roseanne Kaufman, NP on 09/29/20 @ 8:30 AM. Pt isn't happy with that appointment and wants to be see ASAP. Please advise.    Erline Levine please place pt on a cancellation list with any provider.

## 2020-09-17 NOTE — Telephone Encounter (Signed)
I did not write for Entocort. I wrote for Uceris. There is a difference even though they are both budesonide they are formulated to release in a different part of the bowel (entocort more in the distal small bowel for crohn's) and Uceris in the colon for UC.   If he can't get Uceris, then we can do short course of prednisone. He will need to keep his appt so that we can get him restarted on maintenance medication.

## 2020-09-17 NOTE — Telephone Encounter (Signed)
[phone] please all patient about [employer] medication.  He said he has been on it in the past and it worked.  I told him he would probably need an office visit because he has not been here in a while.Marland Kitchen

## 2020-09-17 NOTE — Telephone Encounter (Addendum)
Patient lost to follow up and off medication since 2020. Cancelled colonoscopy in 2020.   Looks like recently placed on Celebrex by PCP (NSAID) which may contribute to UC flare. Consider stopping.  Keep on 09/29/20 but hopefully we will get opening earlier. Agree with cancellation list.   In interim start RX for UC flare for Uceris 69m daily for 8 weeks. (he will get 4 weeks and 1 refill). [facility]

## 2020-09-18 MED ORDER — PREDNISONE 10 MG PO TABS
ORAL_TABLET | ORAL | 0 refills | Status: DC
Start: 2020-09-18 — End: 2021-05-14

## 2020-09-18 NOTE — Telephone Encounter (Signed)
Noted. Pt is aware that RX was sent into her pharmacy.

## 2020-09-18 NOTE — Telephone Encounter (Signed)
Noted. Spoke with pt. I've looked at the price through coupons and the medication is expensive. The medication is expensive through pts insurance as well. Pt would like to do the short course of Prednisone and will keep his apt.

## 2020-09-18 NOTE — Telephone Encounter (Signed)
[facility]

## 2020-09-18 NOTE — Addendum Note (Signed)
Addended by: Mahala Menghini on: 09/18/2020 10:26 AM   Modules accepted: Orders

## 2020-09-28 NOTE — Progress Notes (Signed)
Primary Care Physician:  Chevis Pretty, FNP  Primary GI: Dr. Gala Romney  Patient Location: Home   Provider Location: [facility] office   Reason for Visit: Follow-up for UC   Persons present on the virtual encounter, with roles: Patient and NP   Total time (minutes) spent on medical discussion: 15 minutes   Due to COVID-19, visit was conducted using virtual method.  Visit was requested by patient.  Virtual Visit via Telephone Note Due to COVID-19, visit is conducted virtually and was requested by patient.   I connected with Kyle Sexton on 09/29/20 at  8:30 AM EDT by telephone and verified that I am speaking with the correct person using two identifiers.   I discussed the limitations, risks, security and privacy concerns of performing an evaluation and management service by telephone and the availability of in person appointments. I also discussed with the patient that there may be a patient responsible charge related to this service. The patient expressed understanding and agreed to proceed.  Chief Complaint  Patient presents with  . change in bowel    Still having loose bowels. Started prednisone about 9-10 days ago and is feeling much better now. Reports stomach is much more calm. He thinks the Celebrex caused the flare and has reduced to once a day.     History of Present Illness: Kyle Sexton is a 65 year old male with history of left-sided proctocolitis, diagnosed in June 2014 by colonoscopy.  History of adenoma in the past therefore surveillance colonoscopy was planned in 2019. Presenting now due to flare. Last seen 2 years ago.   Historically on Apriso but became too expensive. He cancelled his colonoscopy in 2020. Now with flare starting late April and started on prednisone taper by our office 2 weeks ago.   About a month ago started having symptoms. First flare in 6-7 years. Symptoms got progressively worse. Apriso worked well in the past but cost precluded  this. Went about 2 years without any maintenance. Symptoms including 10-12 bowel movements a day and lots of gas. Felt weak and dehydrated. On 2nd week of steroid and feels much more calmed down. Now less frequent diarrhea, about 2-3 times per day. Baseline for patient usually 2-3 times per day. No rectal bleeding. On Celebrex but reduced to one pill a day. Has history of arthritis. Will take tylenol prn. Not taking Ibuprofen.   On the 20 mg of prednisone now, and will take for a week, followed by 10 mg for a week and then 5 mg for a week.   Past Medical History:  Diagnosis Date  . Hyperlipidemia   . Ulcerative proctocolitis without complication [facility])      Past Surgical History:  Procedure Laterality Date  . COLONOSCOPY  2007   Dr. Gala Romney: diminutive rectal polyp and 2X2 cm complex, carpet-like polyp, removed via piecemeal fashion, with remaining tissue ablated with APC. fragments of tubular adenoma  . COLONOSCOPY N/A 11/06/2012   VVO:HYWV sided proctocolitis. Colonic diverticulosis  . KNEE SURGERY     both     Current Meds  Medication Sig  . acetaminophen (TYLENOL) 500 MG tablet Take 500 mg by mouth every 6 (six) hours as needed for pain.  . celecoxib (CELEBREX) 200 MG capsule Take 1 capsule (200 mg total) by mouth 2 (two) times daily. (Patient taking differently: Take 200 mg by mouth daily.)  . cyclobenzaprine (FLEXERIL) 5 MG tablet Take 1 tablet (5 mg total) by mouth 3 (three) times  daily as needed for muscle spasms.  . mesalamine (APRISO) 0.375 g 24 hr capsule Take 4 capsules (1.5 g total) by mouth daily.  . predniSONE (DELTASONE) 10 MG tablet Take 19m daily for 7 days, 247mdaily for 7 days, 1054maily for 7 days, 5mg70mily for 7 days.  . tadalafil (CIALIS) 20 MG tablet Take 0.5-1 tablets (10-20 mg total) by mouth every other day as needed for erectile dysfunction.     Family History  Problem Relation Age of Onset  . Crohn's disease Son   . Diabetes Mother   . Colon cancer Neg  Hx     Social History   Socioeconomic History  . Marital status: Married    Spouse name: Not on file  . Number of children: Not on file  . Years of education: Not on file  . Highest education level: Not on file  Occupational History  . Not on file  Tobacco Use  . Smoking status: Former [employer] scientist (life sciences)Smokeless tobacco: Current User    Types: Chew  Vaping Use  . Vaping Use: Never used  Substance and Sexual Activity  . Alcohol use: Yes    Comment: 2 beers on occasion  . Drug use: No  . Sexual activity: Not on file  Other Topics Concern  . Not on file  Social History Narrative  . Not on file   Social Determinants of Health   Financial Resource Strain: Not on file  Food Insecurity: Not on file  Transportation Needs: Not on file  Physical Activity: Not on file  Stress: Not on file  Social Connections: Not on file       Review of Systems: Gen: Denies fever, chills, anorexia. Denies fatigue, weakness, weight loss.  CV: Denies chest pain, palpitations, syncope, peripheral edema, and claudication. Resp: Denies dyspnea at rest, cough, wheezing, coughing up blood, and pleurisy. GI: see HPI Derm: Denies rash, itching, dry skin Psych: Denies depression, anxiety, memory loss, confusion. No homicidal or suicidal ideation.  Heme: Denies bruising, bleeding, and enlarged lymph nodes.  Observations/Objective: No distress. Unable to perform physical exam due to telephone encounter. No video available.   Assessment and Plan: Kyle Sexton is a 64 y66r old male with history of left-sided proctocolitis, diagnosed in June 2014 by colonoscopy.  History of adenoma in the remote past (2014) and overdue for surveillance.  Presenting now due to flare. Last seen 2 years ago.   Historically on Apriso but became too expensive. He has been without maintenance treatment for several years. Notes Apriso resulted in best symptom management. He has improved on prednisone, now 2 weeks in and with 2  weeks left to complete, near baseline. We discussed resuming mesalamine and pursuing colonoscopy in the near future for history of polyp and will be good to reassess extent of disease.   He would like to resume Apriso, as this worked best for him in the past. Unfortunately, insurance coverage may prove to be an issue. Last renal function normal a year ago, and I have asked that he update labs now.   1. Proceed with colonoscopy by Dr. RourGala Romneynear future using PROPOFOL: the risks, benefits, and alternatives have been discussed with the patient in detail. The patient states understanding and desires to proceed. 2. [facility] daily sent to pharmacy; may need to change this if not covered 4. Call if recurrent symptoms 5. Close follow-up in 3-4 months.   Follow Up Instructions:  I discussed the assessment and treatment plan with the patient. The patient was provided an opportunity to ask questions and all were answered. The patient agreed with the plan and demonstrated an understanding of the instructions.   The patient was advised to call back or seek an in-person evaluation if the symptoms worsen or if the condition fails to improve as anticipated.  I provided 15 minutes of non face-to-face time during this telephone encounter.  Annitta Needs, PhD, ANP-BC [facility]

## 2020-09-29 ENCOUNTER — Encounter: Payer: Self-pay | Admitting: Gastroenterology

## 2020-09-29 ENCOUNTER — Ambulatory Visit (INDEPENDENT_AMBULATORY_CARE_PROVIDER_SITE_OTHER): Payer: BC Managed Care – PPO | Admitting: Gastroenterology

## 2020-09-29 ENCOUNTER — Telehealth: Payer: Self-pay | Admitting: *Deleted

## 2020-09-29 ENCOUNTER — Telehealth: Payer: Self-pay | Admitting: Gastroenterology

## 2020-09-29 DIAGNOSIS — K5120 Ulcerative (chronic) proctitis without complications: Secondary | ICD-10-CM | POA: Diagnosis not present

## 2020-09-29 DIAGNOSIS — Z86010 Personal history of colonic polyps: Secondary | ICD-10-CM | POA: Diagnosis not present

## 2020-09-29 MED ORDER — MESALAMINE ER 0.375 G PO CP24: 1.5000 g | ORAL_CAPSULE | Freq: Every day | ORAL | 5 refills | Status: DC

## 2020-09-29 NOTE — Telephone Encounter (Signed)
Pt consented to a telephone visit.

## 2020-09-29 NOTE — Telephone Encounter (Signed)
Visit details for [facility] clinical pool and nurse:  [facility] clinical pool: Needs TCS with Dr. Gala Romney, Propofol, due to history of polyps and UC. Would like on a Friday. ASA 2.   [facility] Nurse: Please have labs faxed to [place] at [facility] (unless they show in the system as ordered automatically. I have placed orders).  Also, I sent in [employer]. This may or may not be covered.   Manuela Schwartz: needs follow-up in 3-4 months.

## 2020-09-29 NOTE — Telephone Encounter (Signed)
Will call patient to schedule procedure when we receive his July/august/September scheduled.

## 2020-09-29 NOTE — Progress Notes (Signed)
Cc'ed to pcp °

## 2020-09-29 NOTE — Telephone Encounter (Signed)
FYI: I phoned and spoke with the pt i'm going to mail out forms today because everytime we fax to [place] it takes 4 to 5 different fax numbers and its just easier for them to take it in hand.

## 2020-09-29 NOTE — Telephone Encounter (Signed)
Kyle Sexton, you are scheduled for a virtual visit with your provider today.  Just as we do with appointments in the office, we must obtain your consent to participate.  Your consent will be active for this visit and any virtual visit you may have with one of our providers in the next 365 days.  If you have a MyChart account, I can also send a copy of this consent to you electronically.  All virtual visits are billed to your insurance company just like a traditional visit in the office.  As this is a virtual visit, video technology does not allow for your provider to perform a traditional examination.  This may limit your provider's ability to fully assess your condition.  If your provider identifies any concerns that need to be evaluated in person or the need to arrange testing such as labs, EKG, etc, we will make arrangements to do so.  Although advances in technology are sophisticated, we cannot ensure that it will always work on either your end or our end.  If the connection with a video visit is poor, we may have to switch to a telephone visit.  With either a video or telephone visit, we are not always able to ensure that we have a secure connection.   I need to obtain your verbal consent now.   Are you willing to proceed with your visit today?

## 2020-09-29 NOTE — Patient Instructions (Signed)
We are arranging a colonoscopy in the near future due to your history of colon polyps. We will also be able to see the colon well and how UC is faring. We may need to adjust therapy depending on findings.   Please continue the prednisone taper as planned. Please call if any recurrent symptoms.  I have sent in [place]. Take 4 capsules once each morning. Please call if this is not covered well. We will have to find the best alternative for you.  Please complete blood work at [place] [facility] in the next few days. We will have to monitor your kidney function while on a mesalamine.  We will see you back in 3-4 months!  Annitta Needs, PhD, ANP-BC [facility]

## 2020-10-02 ENCOUNTER — Other Ambulatory Visit: Payer: Self-pay | Admitting: *Deleted

## 2020-10-02 ENCOUNTER — Telehealth: Payer: Self-pay | Admitting: Internal Medicine

## 2020-10-02 ENCOUNTER — Other Ambulatory Visit: Payer: BC Managed Care – PPO

## 2020-10-02 ENCOUNTER — Other Ambulatory Visit: Payer: Self-pay

## 2020-10-02 DIAGNOSIS — K5120 Ulcerative (chronic) proctitis without complications: Secondary | ICD-10-CM

## 2020-10-02 DIAGNOSIS — Z86010 Personal history of colonic polyps: Secondary | ICD-10-CM

## 2020-10-02 NOTE — Telephone Encounter (Signed)
OV made and appt card mailed

## 2020-10-02 NOTE — Telephone Encounter (Signed)
Changed accordingly and faxed to [phone].

## 2020-10-02 NOTE — Telephone Encounter (Signed)
Pt's PCP at [facility] called to say patient was there now to have blood drawn and we needed to change the orders to [place]. Fax [phone]

## 2020-10-03 LAB — CMP14+EGFR
ALT: 19 IU/L (ref 0–44)
AST: 19 IU/L (ref 0–40)
Albumin/Globulin Ratio: 1.8 (ref 1.2–2.2)
Albumin: 4.2 g/dL (ref 3.8–4.8)
Alkaline Phosphatase: 65 IU/L (ref 44–121)
BUN/Creatinine Ratio: 11 (ref 10–24)
BUN: 11 mg/dL (ref 8–27)
Bilirubin Total: 0.6 mg/dL (ref 0.0–1.2)
CO2: 28 mmol/L (ref 20–29)
Calcium: 9.4 mg/dL (ref 8.6–10.2)
Chloride: 101 mmol/L (ref 96–106)
Creatinine, Ser: 1.03 mg/dL (ref 0.76–1.27)
Globulin, Total: 2.4 g/dL (ref 1.5–4.5)
Glucose: 94 mg/dL (ref 65–99)
Potassium: 4.6 mmol/L (ref 3.5–5.2)
Sodium: 141 mmol/L (ref 134–144)
Total Protein: 6.6 g/dL (ref 6.0–8.5)
eGFR: 81 mL/min/{1.73_m2} (ref >59)

## 2020-10-03 LAB — CBC WITH DIFFERENTIAL/PLATELET
Basophils Absolute: 0.1 10*3/uL (ref 0.0–0.2)
Basos: 1 %
EOS (ABSOLUTE): 0.1 10*3/uL (ref 0.0–0.4)
Eos: 1 %
Hematocrit: 46 % (ref 37.5–51.0)
Hemoglobin: 15.4 g/dL (ref 13.0–17.7)
Immature Grans (Abs): 0 10*3/uL (ref 0.0–0.1)
Immature Granulocytes: 0 %
Lymphocytes Absolute: 1.3 10*3/uL (ref 0.7–3.1)
Lymphs: 12 %
MCH: 30.3 pg (ref 26.6–33.0)
MCHC: 33.5 g/dL (ref 31.5–35.7)
MCV: 91 fL (ref 79–97)
Monocytes Absolute: 0.6 10*3/uL (ref 0.1–0.9)
Monocytes: 6 %
Neutrophils Absolute: 8.4 10*3/uL — ABNORMAL HIGH (ref 1.4–7.0)
Neutrophils: 80 %
Platelets: 343 10*3/uL (ref 150–450)
RBC: 5.08 x10E6/uL (ref 4.14–5.80)
RDW: 11.9 % (ref 11.6–15.4)
WBC: 10.5 10*3/uL (ref 3.4–10.8)

## 2020-10-27 ENCOUNTER — Telehealth: Payer: Self-pay

## 2020-10-27 ENCOUNTER — Other Ambulatory Visit: Payer: Self-pay

## 2020-10-27 MED ORDER — SUPREP BOWEL PREP KIT 17.5-3.13-1.6 GM/177ML PO SOLN: 1.0000 | ORAL | 0 refills | Status: DC

## 2020-10-27 NOTE — Telephone Encounter (Signed)
Called pt, TCS w/Propofol ASA 2 w/Dr. Gala Romney scheduled for 11/26/20 at 1:45pm. Rx for prep sent to pharmacy (he requested low volume prep). Orders entered. Instructions mailed.

## 2020-11-03 ENCOUNTER — Telehealth: Payer: Self-pay

## 2020-11-03 MED ORDER — CLENPIQ 10-3.5-12 MG-GM -GM/160ML PO SOLN: 1.0000 | Freq: Once | ORAL | 0 refills | Status: AC

## 2020-11-03 NOTE — Telephone Encounter (Signed)
Pt called office and LMOVM. Called pt, he had requested low volume prep when scheduled for TCS. Suprep is $100. Advised pt we can try Clenpiq. Rx for Clenpiq sent to pharmacy. Will mail coupon with new instructions.

## 2020-11-05 ENCOUNTER — Telehealth: Payer: Self-pay

## 2020-11-05 NOTE — Telephone Encounter (Signed)
Pt has been approved for Budesonide ER 9 mg ER tablets from 11/05/2020 through 11/04/2021. Will give to Manuela Schwartz to scan in to the pt's chart.

## 2020-11-26 ENCOUNTER — Encounter (HOSPITAL_COMMUNITY): Payer: Self-pay | Admitting: Internal Medicine

## 2020-11-26 ENCOUNTER — Ambulatory Visit (HOSPITAL_COMMUNITY): Payer: Medicare HMO | Admitting: Anesthesiology

## 2020-11-26 ENCOUNTER — Encounter (HOSPITAL_COMMUNITY)
Admission: RE | Disposition: A | Discharge status: Home / Self Care | Payer: Self-pay | Source: Home / Self Care | Attending: Internal Medicine

## 2020-11-26 ENCOUNTER — Other Ambulatory Visit: Payer: Self-pay

## 2020-11-26 ENCOUNTER — Ambulatory Visit (HOSPITAL_COMMUNITY)
Admission: RE | Admit: 2020-11-26 | Discharge: 2020-11-26 | Disposition: A | Discharge status: Home / Self Care | Payer: Medicare HMO | Attending: Internal Medicine | Admitting: Internal Medicine

## 2020-11-26 DIAGNOSIS — K5289 Other specified noninfective gastroenteritis and colitis: Secondary | ICD-10-CM | POA: Diagnosis not present

## 2020-11-26 DIAGNOSIS — Z87891 Personal history of nicotine dependence: Secondary | ICD-10-CM | POA: Insufficient documentation

## 2020-11-26 DIAGNOSIS — Z791 Long term (current) use of non-steroidal anti-inflammatories (NSAID): Secondary | ICD-10-CM | POA: Insufficient documentation

## 2020-11-26 DIAGNOSIS — K635 Polyp of colon: Secondary | ICD-10-CM | POA: Diagnosis not present

## 2020-11-26 DIAGNOSIS — Z8719 Personal history of other diseases of the digestive system: Secondary | ICD-10-CM | POA: Insufficient documentation

## 2020-11-26 DIAGNOSIS — Z1211 Encounter for screening for malignant neoplasm of colon: Secondary | ICD-10-CM | POA: Insufficient documentation

## 2020-11-26 DIAGNOSIS — Z8379 Family history of other diseases of the digestive system: Secondary | ICD-10-CM | POA: Insufficient documentation

## 2020-11-26 DIAGNOSIS — Z86010 Personal history of colonic polyps: Secondary | ICD-10-CM | POA: Diagnosis not present

## 2020-11-26 DIAGNOSIS — K5150 Left sided colitis without complications: Secondary | ICD-10-CM | POA: Diagnosis not present

## 2020-11-26 DIAGNOSIS — Z79899 Other long term (current) drug therapy: Secondary | ICD-10-CM | POA: Insufficient documentation

## 2020-11-26 DIAGNOSIS — D120 Benign neoplasm of cecum: Secondary | ICD-10-CM | POA: Insufficient documentation

## 2020-11-26 DIAGNOSIS — K5190 Ulcerative colitis, unspecified, without complications: Secondary | ICD-10-CM | POA: Diagnosis not present

## 2020-11-26 DIAGNOSIS — K6289 Other specified diseases of anus and rectum: Secondary | ICD-10-CM | POA: Diagnosis not present

## 2020-11-26 HISTORY — PX: POLYPECTOMY: SHX5525

## 2020-11-26 HISTORY — PX: COLONOSCOPY WITH PROPOFOL: SHX5780

## 2020-11-26 HISTORY — PX: ARGON LASER APPLICATION: SHX5727

## 2020-11-26 HISTORY — PX: BIOPSY: SHX5522

## 2020-11-26 SURGERY — COLONOSCOPY WITH PROPOFOL
Anesthesia: General

## 2020-11-26 MED ORDER — LACTATED RINGERS IV SOLN: INTRAVENOUS | Status: DC

## 2020-11-26 MED ORDER — PHENYLEPHRINE 40 MCG/ML (10ML) SYRINGE FOR IV PUSH (FOR BLOOD PRESSURE SUPPORT)
PREFILLED_SYRINGE | INTRAVENOUS | Status: AC
  Filled 2020-11-26: qty 10

## 2020-11-26 MED ORDER — PROPOFOL 10 MG/ML IV BOLUS
INTRAVENOUS | Status: DC | PRN
  Administered 2020-11-26: 125 ug/kg/min via INTRAVENOUS
  Administered 2020-11-26: 100 mg via INTRAVENOUS

## 2020-11-26 MED ORDER — SODIUM CHLORIDE 0.9 % IV SOLN
INTRAVENOUS | Status: DC | PRN
  Administered 2020-11-26 (×2): 100 ug via INTRAVENOUS

## 2020-11-26 MED ORDER — PROPOFOL 10 MG/ML IV BOLUS
INTRAVENOUS | Status: AC
  Filled 2020-11-26: qty 20

## 2020-11-26 MED ORDER — EPHEDRINE SULFATE 50 MG/ML IJ SOLN
INTRAMUSCULAR | Status: DC | PRN
  Administered 2020-11-26: 10 mg via INTRAVENOUS

## 2020-11-26 MED ORDER — EPHEDRINE 5 MG/ML INJ
INTRAVENOUS | Status: AC
  Filled 2020-11-26: qty 10

## 2020-11-26 NOTE — Discharge Instructions (Signed)
  Colonoscopy Discharge Instructions  Read the instructions outlined below and refer to this sheet in the next few weeks. These discharge instructions provide you with general information on caring for yourself after you leave the hospital. Your doctor may also give you specific instructions. While your treatment has been planned according to the most current medical practices available, unavoidable complications occasionally occur. If you have any problems or questions after discharge, call Dr. Gala Romney at [phone]. ACTIVITY You may resume your regular activity, but move at a slower pace for the next 24 hours.  Take frequent rest periods for the next 24 hours.  Walking will help get rid of the air and reduce the bloated feeling in your belly (abdomen).  No driving for 24 hours (because of the medicine (anesthesia) used during the test).   Do not sign any important legal documents or operate any machinery for 24 hours (because of the anesthesia used during the test).  NUTRITION Drink plenty of fluids.  You may resume your normal diet as instructed by your doctor.  Begin with a light meal and progress to your normal diet. Heavy or fried foods are harder to digest and may make you feel sick to your stomach (nauseated).  Avoid alcoholic beverages for 24 hours or as instructed.  MEDICATIONS You may resume your normal medications unless your doctor tells you otherwise.  WHAT YOU CAN EXPECT TODAY Some feelings of bloating in the abdomen.  Passage of more gas than usual.  Spotting of blood in your stool or on the toilet paper.  IF YOU HAD POLYPS REMOVED DURING THE COLONOSCOPY: No aspirin products for 7 days or as instructed.  No alcohol for 7 days or as instructed.  Eat a soft diet for the next 24 hours.  FINDING OUT THE RESULTS OF YOUR TEST Not all test results are available during your visit. If your test results are not back during the visit, make an appointment with your caregiver to find out the  results. Do not assume everything is normal if you have not heard from your caregiver or the medical facility. It is important for you to follow up on all of your test results.  SEEK IMMEDIATE MEDICAL ATTENTION IF: You have more than a spotting of blood in your stool.  Your belly is swollen (abdominal distention).  You are nauseated or vomiting.  You have a temperature over 101.  You have abdominal pain or discomfort that is severe or gets worse throughout the day.    Your colonoscopy preparation today was poor.  Multiple polyps in your colon.  Multiple samples taken.     Your entire colon could not be seen today due to the poor preparation.  No future MRI until clips gone  Further recommendations to follow pending review of pathology report  Continue Apriso daily  At patient request, I called Izora Gala Sancho at [phone] -reviewed findings and recommendations

## 2020-11-26 NOTE — H&P (Signed)
@LOGO @   Primary Care Physician:  Chevis Pretty, FNP Primary Gastroenterologist:  Dr. Gala Romney  Pre-Procedure History & Physical: HPI:  Kyle Sexton is a 65 y.o. male here for surveillance colonoscopy.  History of colonic adenoma.  He had history of left-sided proctocolitis going back to 2014.  Back on mesalamine.  Bowel function back to baseline.  He does take Celebrex Past Medical History:  Diagnosis Date   Hyperlipidemia    Ulcerative proctocolitis without complication [facility])     Past Surgical History:  Procedure Laterality Date   COLONOSCOPY  2007   Dr. Gala Romney: diminutive rectal polyp and 2X2 cm complex, carpet-like polyp, removed via piecemeal fashion, with remaining tissue ablated with APC. fragments of tubular adenoma   COLONOSCOPY N/A 11/06/2012   JME:QAST sided proctocolitis. Colonic diverticulosis   KNEE SURGERY     both    Prior to Admission medications   Medication Sig Start Date End Date Taking? Authorizing Provider  acetaminophen (TYLENOL) 500 MG tablet Take 500 mg by mouth every 6 (six) hours as needed for pain.   Yes [provider]  celecoxib (CELEBREX) 200 MG capsule Take 1 capsule (200 mg total) by mouth 2 (two) times daily. Patient taking differently: Take 200 mg by mouth daily. 08/21/20  Yes Martin, Mary-Margaret, FNP  Na Sulfate-K Sulfate-Mg Sulf (SUPREP BOWEL PREP KIT) 17.5-3.13-1.6 GM/177ML SOLN Take 1 kit by mouth as directed. 10/27/20  Yes Vander Kueker, Cristopher Estimable, MD  predniSONE (DELTASONE) 10 MG tablet Take 9m daily for 7 days, 211mdaily for 7 days, 1071maily for 7 days, 5mg62mily for 7 days. 09/18/20  Yes LewiMahala Menghini-C  cyclobenzaprine (FLEXERIL) 5 MG tablet Take 1 tablet (5 mg total) by mouth 3 (three) times daily as needed for muscle spasms. 04/10/20   MartHassell Donery-Margaret, FNP  mesalamine (APRISO) 0.375 g 24 hr capsule Take 4 capsules (1.5 g total) by mouth daily. 09/29/20 10/29/20  BoonAnnitta Needs  tadalafil (CIALIS) 20 MG tablet Take 0.5-1  tablets (10-20 mg total) by mouth every other day as needed for erectile dysfunction. 12/10/19   MartChevis PrettyP    Allergies as of 10/27/2020 - Review Complete 09/29/2020  Allergen Reaction Noted   Bee venom Anaphylaxis 10/30/2012    Family History  Problem Relation Age of Onset   Crohn's disease Son    Diabetes Mother    Colon cancer Neg Hx     Social History   Socioeconomic History   Marital status: Married    Spouse name: Not on file   Number of children: Not on file   Years of education: Not on file   Highest education level: Not on file  Occupational History   Not on file  Tobacco Use   Smoking status: Former    Pack years: 0.00   Smokeless tobacco: Current    Types: Chew  Vaping Use   Vaping Use: Never used  Substance and Sexual Activity   Alcohol use: Yes    Comment: 2 beers on occasion   Drug use: No   Sexual activity: Not on file  Other Topics Concern   Not on file  Social History Narrative   Not on file   Social Determinants of Health   Financial Resource Strain: Not on file  Food Insecurity: Not on file  Transportation Needs: Not on file  Physical Activity: Not on file  Stress: Not on file  Social Connections: Not on file  Intimate Partner Violence: Not on file  Review of Systems: See HPI, otherwise negative ROS  Physical Exam: BP 113/72   Temp 98.4 F (36.9 C) (Oral)   Resp 16   Ht 6' 4"  (1.93 m)   Wt 91.2 kg   SpO2 97%   BMI 24.47 kg/m  General:   Alert,  Well-developed, well-nourished, pleasant and cooperative in NAD Neck:  Supple; no masses or thyromegaly. No significant cervical adenopathy. Lungs:  Clear throughout to auscultation.   No wheezes, crackles, or rhonchi. No acute distress. Heart:  Regular rate and rhythm; no murmurs, clicks, rubs,  or gallops. Abdomen: Non-distended, normal bowel sounds.  Soft and nontender without appreciable mass or hepatosplenomegaly.  Pulses:  Normal pulses noted. Extremities:   Without clubbing or edema.  Impression/Plan: 65 year old gent with an 8-year history of left-sided proctocolitis.  Distant history colonic adenoma.  Here for surveillance colonoscopy.  I have offered the patient a surveillance colonoscopy per plan. The risks, benefits, limitations, alternatives and imponderables have been reviewed with the patient. Questions have been answered. All parties are agreeable.       Notice: This dictation was prepared with Dragon dictation along with smaller phrase technology. Any transcriptional errors that result from this process are unintentional and may not be corrected upon review.

## 2020-11-26 NOTE — Anesthesia Preprocedure Evaluation (Addendum)
Anesthesia Evaluation  Patient identified by MRN, date of birth, ID band Patient awake    Reviewed: Allergy & Precautions, NPO status , Patient's Chart, lab work & pertinent test results  History of Anesthesia Complications Negative for: history of anesthetic complications  Airway Mallampati: II  TM Distance: >3 FB Neck ROM: Full    Dental  (+) Dental Advisory Given Crowns :   Pulmonary former smoker,    Pulmonary exam normal breath sounds clear to auscultation       Cardiovascular Exercise Tolerance: Good Normal cardiovascular exam Rhythm:Regular Rate:Normal     Neuro/Psych    GI/Hepatic Neg liver ROS, PUD,   Endo/Other  negative endocrine ROS  Renal/GU negative Renal ROS     Musculoskeletal negative musculoskeletal ROS (+)   Abdominal   Peds  Hematology negative hematology ROS (+)   Anesthesia Other Findings   Reproductive/Obstetrics negative OB ROS                            Anesthesia Physical Anesthesia Plan  ASA: 2  Anesthesia Plan: General   Post-op Pain Management:    Induction: Intravenous  PONV Risk Score and Plan: Propofol infusion  Airway Management Planned: Nasal Cannula and Natural Airway  Additional Equipment:   Intra-op Plan:   Post-operative Plan:   Informed Consent: I have reviewed the patients History and Physical, chart, labs and discussed the procedure including the risks, benefits and alternatives for the proposed anesthesia with the patient or authorized representative who has indicated his/her understanding and acceptance.     Dental advisory given  Plan Discussed with: CRNA and Surgeon  Anesthesia Plan Comments:         Anesthesia Quick Evaluation

## 2020-11-26 NOTE — Transfer of Care (Signed)
Immediate Anesthesia Transfer of Care Note  Patient: Kyle Sexton  Procedure(s) Performed: COLONOSCOPY WITH PROPOFOL POLYPECTOMY ARGON LASER APPLICATION BIOPSY  Patient Location: Short Stay  Anesthesia Type:General  Level of Consciousness: awake, alert , oriented and patient cooperative  Airway & Oxygen Therapy: Patient Spontanous Breathing and Patient connected to nasal cannula oxygen  Post-op Assessment: Report given to RN, Post -op Vital signs reviewed and stable and Patient moving all extremities  Post vital signs: Reviewed and stable  Last Vitals:  Vitals Value Taken Time  BP    Temp    Pulse    Resp    SpO2      Last Pain:  Vitals:   11/26/20 1338  TempSrc:   PainSc: 0-No pain         Complications: No notable events documented.

## 2020-11-26 NOTE — Anesthesia Postprocedure Evaluation (Signed)
Anesthesia Post Note  Patient: Kyle Sexton  Procedure(s) Performed: COLONOSCOPY WITH PROPOFOL POLYPECTOMY ARGON LASER APPLICATION BIOPSY  Patient location during evaluation: Endoscopy Anesthesia Type: General Level of consciousness: awake and alert and oriented Pain management: pain level controlled Vital Signs Assessment: post-procedure vital signs reviewed and stable Respiratory status: spontaneous breathing and respiratory function stable Cardiovascular status: blood pressure returned to baseline and stable Postop Assessment: no apparent nausea or vomiting Anesthetic complications: no   No notable events documented.   Last Vitals:  Vitals:   11/26/20 1237 11/26/20 1433  BP: 113/72 (!) 90/56  Pulse:  65  Resp: 16 18  Temp: 36.9 C 36.7 C  SpO2: 97% 97%    Last Pain:  Vitals:   11/26/20 1433  TempSrc: Oral  PainSc: 0-No pain                 Shawnn Bouillon C [facility]

## 2020-11-26 NOTE — Op Note (Signed)
[facility] Patient Name: Kyle Sexton Procedure Date: 11/26/2020 1:26 PM MRN: [id number] Date of Birth: 1955/10/28 Attending MD: Norvel Richards , MD CSN: [id number] Age: 65 Admit Type: Outpatient Procedure:                Colonoscopy Indications:              High risk colon cancer surveillance: Personal                            history of colonic polyps; history of left-sided                            procto-colitis. Providers:                Norvel Richards, MD, Gwenlyn Fudge, RN, Randa Spike, Technician Referring MD:              Medicines:                Propofol per Anesthesia Complications:            No immediate complications. Estimated Blood Loss:     Estimated blood loss was minimal. Procedure:                Pre-Anesthesia Assessment:                           - Prior to the procedure, a History and Physical                            was performed, and patient medications and                            allergies were reviewed. The patient's tolerance of                            previous anesthesia was also reviewed. The risks                            and benefits of the procedure and the sedation                            options and risks were discussed with the patient.                            All questions were answered, and informed consent                            was obtained. Prior Anticoagulants: The patient has                            taken no previous anticoagulant or antiplatelet                            agents. ASA Grade  Assessment: II - A patient with                            mild systemic disease. After reviewing the risks                            and benefits, the patient was deemed in                            satisfactory condition to undergo the procedure.                           After obtaining informed consent, the colonoscope                            was passed under direct  vision. Throughout the                            procedure, the patient's blood pressure, pulse, and                            oxygen saturations were monitored continuously. The                            CF-HQ190L ([id number]) scope was introduced through                            the anus and advanced to the the cecum, identified                            by appendiceal orifice and ileocecal valve. The                            colonoscopy was performed without difficulty. The                            patient tolerated the procedure well. The quality                            of the bowel preparation was adequate. Scope In: 1:43:02 PM Scope Out: 2:27:26 PM Scope Withdrawal Time: 0 hours 38 minutes 35 seconds  Total Procedure Duration: 0 hours 44 minutes 24 seconds  Findings:      The perianal and digital rectal examinations were normal. Multiple       clusters of hyperplastic appearing "pseudopolyps throughout the colon       more prominent in the transverse and hepatic flexure area. Clustering of       "pseudopolyps" of the hepatic flexure were biopsied for histologic study.      (1) 4x5 carpet polyp in the cecum. At the ileocecal valve. Second 1 cm       polyp at the opening of the appendiceal orifice. These polyps were       removed in piecemeal fashion with hot snare. The periphery of the 4 x 5       cm carpet polypectomy site  was ablated with tip of APC circular probe       right colon settings. This polypectomy site was sealed with clips x3       also, segmental biopsies of the ascending, transverse, descending,       sigmoid and rectal mucosa were taken for histologic study. Impression:               - Diffusely abnormal mild changes of inflammatory                            bowel disease/ulcerative colitis. Status post                            segmental biopsy. Multiple clusters of hyperplastic                            appearing polyps throughout the colon most likely                             representing pseudopolyps status post biopsy of                            hepatic flexure cluster.                           ?"Status post hot snare removal of the cecal polyps                            as described above with hemostasis clips placed. Moderate Sedation:      Moderate (conscious) sedation was personally administered by an       anesthesia professional. The following parameters were monitored: oxygen       saturation, heart rate, blood pressure, respiratory rate, EKG, adequacy       of pulmonary ventilation, and response to care. Recommendation:           - Patient has a contact number available for                            emergencies. The signs and symptoms of potential                            delayed complications were discussed with the                            patient. Return to normal activities tomorrow.                            Written discharge instructions were provided to the                            patient.                           - Advance diet as tolerated. Procedure Code(s):        --- Professional ---  45378, Colonoscopy, flexible; diagnostic, including                            collection of specimen(s) by brushing or washing,                            when performed (separate procedure) Diagnosis Code(s):        --- Professional ---                           Z86.010, Personal history of colonic polyps CPT copyright 2019 American Medical Association. All rights reserved. The codes documented in this report are preliminary and upon coder review may  be revised to meet current compliance requirements. Kyle Sexton. Kyle Taves, MD Norvel Richards, MD 11/26/2020 2:45:00 PM This report has been signed electronically. Number of Addenda: 0

## 2020-11-27 ENCOUNTER — Telehealth: Payer: Self-pay

## 2020-11-27 NOTE — Telephone Encounter (Signed)
[employer] Premier Plus Plan (PPO) has provided the pt with a temporary supply of the following prescription: Mesalamine Cap 0.375gm. Pt had it filled 11/26/2020.   This is not on the formulary drug list and the company will not continue to pay for this drug after this pt receives the maximum 30 day temporary supply.that they are required to cover unless the pt obtain a formulary exception from the company. I will hang on to this paperwork if you want to see it and begin to find out how/what you would like to do regarding this.

## 2020-11-30 LAB — SURGICAL PATHOLOGY

## 2020-12-01 ENCOUNTER — Encounter: Payer: Self-pay | Admitting: Internal Medicine

## 2020-12-01 NOTE — Telephone Encounter (Signed)
Kyle Sexton, we need to know what is on the formulary list, then I can send that in. However, he may need escalation of therapy as his colonoscopy still shows active disease. How is he doing? Important to keep follow-up for September upcoming.

## 2020-12-03 ENCOUNTER — Encounter (HOSPITAL_COMMUNITY): Payer: Self-pay | Admitting: Internal Medicine

## 2020-12-04 ENCOUNTER — Encounter: Payer: Self-pay | Admitting: Internal Medicine

## 2020-12-04 ENCOUNTER — Telehealth: Payer: Self-pay | Admitting: Internal Medicine

## 2020-12-04 NOTE — Telephone Encounter (Signed)
Pt called to see if his colonoscopy results are back yet. [phone]

## 2020-12-07 ENCOUNTER — Telehealth: Payer: Self-pay | Admitting: Internal Medicine

## 2020-12-07 NOTE — Telephone Encounter (Signed)
Spoke to pt. Informed him of findings. Pt. Informed me that he received the letter. Voiced that he understood findings. Pt also informed me that he is take Mesalamine and the insurance company will not cover it. They told him that he needs to find something cheaper. Pt stated that he wants Mesalamine because this is the only medicine that has worked for him.

## 2020-12-07 NOTE — Telephone Encounter (Signed)
Rourk, Cristopher Estimable, MD  You 3 days ago     I sent him a letter.  Please call him and convey the contents of the letter please thanks.    You routed conversation to [facility] Nurse 3 days ago   You 3 days ago   SS  Pt called to see if his colonoscopy results are back yet. [phone]      Note

## 2020-12-07 NOTE — Telephone Encounter (Signed)
Called [facility] and spoke to representative.  She informed me that pt would owe $100 copay for medication.  Called pt back to make him aware of information given.  He said that $100 is fine and he misunderstood.  Pt wants to keep taking medication and will pick it up.

## 2020-12-09 NOTE — Telephone Encounter (Signed)
I looked up mesalamine and budesonide and they treat the same things. It also states that budesonide is better in some ways. So if this is what the pt is taking for his ulcerative colitis we don't need to find anything else because it was approved 11/05/2020 through 11/04/2021. What do you recommend?

## 2020-12-11 ENCOUNTER — Encounter: Payer: BC Managed Care – PPO | Admitting: Nurse Practitioner

## 2020-12-15 NOTE — Telephone Encounter (Signed)
Dena and Angie:  I reviewed phone notes. Appears initially there was concern about coverage for mesalamine (78/22 phone note). Phone note dated 7/18 states patient will be paying 100$ for mesalamine and he agrees to do this.  From what I can gather, nothing more needs to be done unless he needs further prescriptions. I am glad he is able to continue mesalamine.

## 2020-12-15 NOTE — Telephone Encounter (Signed)
noted 

## 2020-12-15 NOTE — Telephone Encounter (Signed)
Noted. Thanks.

## 2020-12-29 ENCOUNTER — Telehealth: Payer: Self-pay | Admitting: Internal Medicine

## 2020-12-29 NOTE — Telephone Encounter (Signed)
Pt states that the pharmacy informed him that medicare wasn't going to pay for his mesalamine 0.375 g capsules anymore. Pt was unable to tell me why. [facility] [place] and the lady there told me that it only stated in the computer that it was no longer preferred and that it did not state that it was requiring a PA. The lady gave me the number to his part [place] and after speaking with them came to the conclusion that it in fact needed a PA. A key was obtained and the PA has been submitted. Pending ins. Approval/denial.

## 2020-12-29 NOTE — Telephone Encounter (Signed)
Approval has been obtained for MESALAMINE ER Capsule ER 24 HR from 11/20/2020 - 05/22/2021. No approval # on fax. Approval letter to be scanned into the chart. Failed medications used for approval were: balsalazide, budesonide, budesonide ER, and mesalamine DR. Pt and pharmacy have been notified.

## 2020-12-29 NOTE — Telephone Encounter (Signed)
[place] PATIENT ABOUT HIS MEDICATION, SAID THAT THE PRICE HAS CHANGED AND HE NEEDS AN ALTERNATIVE

## 2021-01-20 ENCOUNTER — Other Ambulatory Visit: Payer: Self-pay | Admitting: Nurse Practitioner

## 2021-01-20 DIAGNOSIS — M19049 Primary osteoarthritis, unspecified hand: Secondary | ICD-10-CM

## 2021-02-16 ENCOUNTER — Ambulatory Visit: Payer: BC Managed Care – PPO | Admitting: Gastroenterology

## 2021-02-26 DIAGNOSIS — H2513 Age-related nuclear cataract, bilateral: Secondary | ICD-10-CM | POA: Diagnosis not present

## 2021-02-26 DIAGNOSIS — H2511 Age-related nuclear cataract, right eye: Secondary | ICD-10-CM | POA: Diagnosis not present

## 2021-03-26 ENCOUNTER — Other Ambulatory Visit: Payer: Self-pay | Admitting: Gastroenterology

## 2021-04-08 DIAGNOSIS — H2511 Age-related nuclear cataract, right eye: Secondary | ICD-10-CM | POA: Diagnosis not present

## 2021-04-22 DIAGNOSIS — H2512 Age-related nuclear cataract, left eye: Secondary | ICD-10-CM | POA: Diagnosis not present

## 2021-05-14 ENCOUNTER — Ambulatory Visit (INDEPENDENT_AMBULATORY_CARE_PROVIDER_SITE_OTHER): Payer: Medicare HMO | Admitting: Nurse Practitioner

## 2021-05-14 ENCOUNTER — Encounter: Payer: Self-pay | Admitting: Nurse Practitioner

## 2021-05-14 ENCOUNTER — Encounter: Payer: Medicare HMO | Admitting: Nurse Practitioner

## 2021-05-14 ENCOUNTER — Other Ambulatory Visit: Payer: Self-pay | Admitting: Nurse Practitioner

## 2021-05-14 VITALS — BP 118/75 | HR 78 | Temp 97.9°F | Resp 20 | Ht 76.0 in | Wt 206.0 lb

## 2021-05-14 DIAGNOSIS — B351 Tinea unguium: Secondary | ICD-10-CM

## 2021-05-14 DIAGNOSIS — M19049 Primary osteoarthritis, unspecified hand: Secondary | ICD-10-CM

## 2021-05-14 DIAGNOSIS — Z0001 Encounter for general adult medical examination with abnormal findings: Secondary | ICD-10-CM | POA: Diagnosis not present

## 2021-05-14 DIAGNOSIS — E782 Mixed hyperlipidemia: Secondary | ICD-10-CM | POA: Diagnosis not present

## 2021-05-14 DIAGNOSIS — Z125 Encounter for screening for malignant neoplasm of prostate: Secondary | ICD-10-CM | POA: Diagnosis not present

## 2021-05-14 DIAGNOSIS — N400 Enlarged prostate without lower urinary tract symptoms: Secondary | ICD-10-CM | POA: Diagnosis not present

## 2021-05-14 MED ORDER — MESALAMINE ER 0.375 G PO CP24: 1.5000 g | ORAL_CAPSULE | Freq: Every day | ORAL | 5 refills | Status: DC

## 2021-05-14 MED ORDER — CICLOPIROX 8 % EX SOLN: Freq: Every day | CUTANEOUS | 1 refills | Status: DC

## 2021-05-14 NOTE — Progress Notes (Addendum)
Subjective:    Patient ID: Kyle Sexton, male    DOB: 1955-07-27, 65 y.o.   MRN: [id number]   Chief Complaint: annual exam     HPI:  Kyle Sexton is a 65 y.o. who identifies as a male who was assigned male at birth.   Social history: Lives with: hisself Work history: Engineering geologist   Comes in today for follow up of the following chronic medical issues:  1. Mixed hyperlipidemia Does watch diet and stays very active Lab Results  Component Value Date   CHOL 178 12/04/2019   HDL 60 12/04/2019   LDLCALC 101 (H) 12/04/2019   TRIG 95 12/04/2019   CHOLHDL 3.0 12/04/2019     2. Benign prostatic hyperplasia without lower urinary tract symptoms Denies any problems voiding   New complaints: None today  Allergies  Allergen Reactions   Bee Venom Anaphylaxis   Outpatient Encounter Medications as of 05/14/2021  Medication Sig   acetaminophen (TYLENOL) 500 MG tablet Take 500 mg by mouth every 6 (six) hours as needed for pain.   celecoxib (CELEBREX) 200 MG capsule TAKE 1 CAPSULE BY MOUTH TWICE A DAY   cyclobenzaprine (FLEXERIL) 5 MG tablet Take 1 tablet (5 mg total) by mouth 3 (three) times daily as needed for muscle spasms.   mesalamine (APRISO) 0.375 g 24 hr capsule TAKE 4 CAPSULES BY MOUTH DAILY.   Na Sulfate-K Sulfate-Mg Sulf (SUPREP BOWEL PREP KIT) 17.5-3.13-1.6 GM/177ML SOLN Take 1 kit by mouth as directed.   predniSONE (DELTASONE) 10 MG tablet Take 69m daily for 7 days, 237mdaily for 7 days, 1066maily for 7 days, 5mg76mily for 7 days.   tadalafil (CIALIS) 20 MG tablet Take 0.5-1 tablets (10-20 mg total) by mouth every other day as needed for erectile dysfunction.   No facility-administered encounter medications on file as of 05/14/2021.    Past Surgical History:  Procedure Laterality Date   ARGON LASER APPLICATION  7/7/2/7/0786rocedure: ARGON LASER APPLICATION;  Surgeon: RourDaneil Dolin;  [facility];  Service: Endoscopy;;   BIOPSY  11/26/2020    Procedure: BIOPSY;  Surgeon: RourDaneil Dolin;  [facility];  Service: Endoscopy;;   COLONOSCOPY  2007   Dr. RourGala Romneyminutive rectal polyp and 2X2 cm complex, carpet-like polyp, removed via piecemeal fashion, with remaining tissue ablated with APC. fragments of tubular adenoma   COLONOSCOPY N/A 11/06/2012   RMR:LJQ:GBEEed proctocolitis. Colonic diverticulosis   COLONOSCOPY WITH PROPOFOL N/A 11/26/2020   Procedure: COLONOSCOPY WITH PROPOFOL;  Surgeon: RourDaneil Dolin;  [facility];  Service: Endoscopy;  Laterality: N/A;  1:45pm   KNEE SURGERY     both   POLYPECTOMY  11/26/2020   Procedure: POLYPECTOMY;  Surgeon: RourDaneil Dolin;  [facility];  Service: Endoscopy;;    Family History  Problem Relation Age of Onset   Crohn's disease Son    Diabetes Mother    Colon cancer Neg Hx       Controlled substance contract: n/a     Review of Systems  Constitutional:  Negative for diaphoresis.  Eyes:  Negative for pain.  Respiratory:  Negative for shortness of breath.   Cardiovascular:  Negative for chest pain, palpitations and leg swelling.  Gastrointestinal:  Negative for abdominal pain.  Endocrine: Negative for polydipsia.  Skin:  Negative for rash.  Neurological:  Negative for dizziness, weakness and headaches.  Hematological:  Does not bruise/bleed easily.  All other systems reviewed and are negative.     Objective:   Physical Exam Vitals and nursing note reviewed.  Constitutional:      Appearance: Normal appearance. He is well-developed.  HENT:     Head: Normocephalic.     Nose: Nose normal.     Mouth/Throat:     Mouth: Mucous membranes are moist.     Pharynx: Oropharynx is clear.  Eyes:     Pupils: Pupils are equal, round, and reactive to light.  Neck:     Thyroid: No thyroid mass or thyromegaly.     Vascular: No carotid bruit or JVD.     Trachea: Phonation normal.  Cardiovascular:     Rate and Rhythm: Normal rate and  regular rhythm.  Pulmonary:     Effort: Pulmonary effort is normal. No respiratory distress.     Breath sounds: Normal breath sounds.  Abdominal:     General: Bowel sounds are normal.     Palpations: Abdomen is soft.     Tenderness: There is no abdominal tenderness.  Musculoskeletal:        General: Normal range of motion.     Cervical back: Normal range of motion and neck supple.  Lymphadenopathy:     Cervical: No cervical adenopathy.  Skin:    General: Skin is warm and dry.     Comments: Ft great toe nail is blackish green and about to fall off.  Neurological:     Mental Status: He is alert and oriented to person, place, and time.  Psychiatric:        Behavior: Behavior normal.        Thought Content: Thought content normal.        Judgment: Judgment normal.    BP 118/75    Pulse 78    Temp 97.9 F (36.6 C) (Temporal)    Resp 20    Ht 6' 4"  (1.93 m)    Wt 206 lb (93.4 kg)    SpO2 98%    BMI 25.08 kg/m        Assessment & Plan:   Kyle Sexton comes in today with chief complaint of Annual Exam (Need meds for toe fungus/)   Diagnosis and orders addressed:  1. Mixed hyperlipidemia Low fat diet  2. Benign prostatic hyperplasia without lower urinary tract symptoms Report any problems voiding  3.. onychomycosis Penlac to be used on new nail when grows out.  Labs pending Health Maintenance reviewed Diet and exercise encouraged  Follow up plan: 6 months   Mary-Margaret Hassell Done, FNP

## 2021-05-14 NOTE — Addendum Note (Signed)
Addended by: Chevis Pretty on: 05/14/2021 11:37 AM   Modules accepted: Orders

## 2021-05-14 NOTE — Patient Instructions (Signed)
Exercising to Stay Healthy °To become healthy and stay healthy, it is recommended that you do moderate-intensity and vigorous-intensity exercise. You can tell that you are exercising at a moderate intensity if your heart starts beating faster and you start breathing faster but can still hold a conversation. You can tell that you are exercising at a vigorous intensity if you are breathing much harder and faster and cannot hold a conversation while exercising. °How can exercise benefit me? °Exercising regularly is important. It has many health benefits, such as: °Improving overall fitness, flexibility, and endurance. °Increasing bone density. °Helping with weight control. °Decreasing body fat. °Increasing muscle strength and endurance. °Reducing stress and tension, anxiety, depression, or anger. °Improving overall health. °What guidelines should I follow while exercising? °Before you start a new exercise program, talk with your health care provider. °Do not exercise so much that you hurt yourself, feel dizzy, or get very short of breath. °Wear comfortable clothes and wear shoes with good support. °Drink plenty of water while you exercise to prevent dehydration or heat stroke. °Work out until your breathing and your heartbeat get faster (moderate intensity). °How often should I exercise? °Choose an activity that you enjoy, and set realistic goals. Your health care provider can help you make an activity plan that is individually designed and works best for you. °Exercise regularly as told by your health care provider. This may include: °Doing strength training two times a week, such as: °Lifting weights. °Using resistance bands. °Push-ups. °Sit-ups. °Yoga. °Doing a certain intensity of exercise for a given amount of time. Choose from these options: °A total of 150 minutes of moderate-intensity exercise every week. °A total of 75 minutes of vigorous-intensity exercise every week. °A mix of moderate-intensity and  vigorous-intensity exercise every week. °Children, pregnant women, people who have not exercised regularly, people who are overweight, and older adults may need to talk with a health care provider about what activities are safe to perform. If you have a medical condition, be sure to talk with your health care provider before you start a new exercise program. °What are some exercise ideas? °Moderate-intensity exercise ideas include: °Walking 1 mile (1.6 km) in about 15 minutes. °Biking. °Hiking. °Golfing. °Dancing. °Water aerobics. °Vigorous-intensity exercise ideas include: °Walking 4.5 miles (7.2 km) or more in about 1 hour. °Jogging or running 5 miles (8 km) in about 1 hour. °Biking 10 miles (16.1 km) or more in about 1 hour. °Lap swimming. °Roller-skating or in-line skating. °Cross-country skiing. °Vigorous competitive sports, such as football, basketball, and soccer. °Jumping rope. °Aerobic dancing. °What are some everyday activities that can help me get exercise? °Yard work, such as: °Pushing a lawn mower. °Raking and bagging leaves. °Washing your car. °Pushing a stroller. °Shoveling snow. °Gardening. °Washing windows or floors. °How can I be more active in my day-to-day activities? °Use stairs instead of an elevator. °Take a walk during your lunch break. °If you drive, park your car farther away from your work or school. °If you take public transportation, get off one stop early and walk the rest of the way. °Stand up or walk around during all of your indoor phone calls. °Get up, stretch, and walk around every 30 minutes throughout the day. °Enjoy exercise with a friend. Support to continue exercising will help you keep a regular routine of activity. °Where to find more information °You can find more information about exercising to stay healthy from: °U.S. Department of Health and Human Services: www.hhs.gov °Centers for Disease Control and Prevention (  CDC): www.cdc.gov °Summary °Exercising regularly is  important. It will improve your overall fitness, flexibility, and endurance. °Regular exercise will also improve your overall health. It can help you control your weight, reduce stress, and improve your bone density. °Do not exercise so much that you hurt yourself, feel dizzy, or get very short of breath. °Before you start a new exercise program, talk with your health care provider. °This information is not intended to replace advice given to you by your health care provider. Make sure you discuss any questions you have with your health care provider. °Document Revised: 09/04/2020 Document Reviewed: 09/04/2020 °Elsevier Patient Education © 2022 [employer] Inc. ° °

## 2021-05-15 LAB — LIPID PANEL
Chol/HDL Ratio: 2.6 ratio (ref 0.0–5.0)
Cholesterol, Total: 170 mg/dL (ref 100–199)
HDL: 65 mg/dL (ref >39)
LDL Chol Calc (NIH): 95 mg/dL (ref 0–99)
Triglycerides: 49 mg/dL (ref 0–149)
VLDL Cholesterol Cal: 10 mg/dL (ref 5–40)

## 2021-05-15 LAB — CMP14+EGFR
ALT: 24 IU/L (ref 0–44)
AST: 23 IU/L (ref 0–40)
Albumin/Globulin Ratio: 2.6 — ABNORMAL HIGH (ref 1.2–2.2)
Albumin: 4.4 g/dL (ref 3.8–4.8)
Alkaline Phosphatase: 61 IU/L (ref 44–121)
BUN/Creatinine Ratio: 19 (ref 10–24)
BUN: 18 mg/dL (ref 8–27)
Bilirubin Total: 0.5 mg/dL (ref 0.0–1.2)
CO2: 27 mmol/L (ref 20–29)
Calcium: 9.1 mg/dL (ref 8.6–10.2)
Chloride: 103 mmol/L (ref 96–106)
Creatinine, Ser: 0.95 mg/dL (ref 0.76–1.27)
Globulin, Total: 1.7 g/dL (ref 1.5–4.5)
Glucose: 85 mg/dL (ref 70–99)
Potassium: 4.4 mmol/L (ref 3.5–5.2)
Sodium: 141 mmol/L (ref 134–144)
Total Protein: 6.1 g/dL (ref 6.0–8.5)
eGFR: 88 mL/min/{1.73_m2} (ref >59)

## 2021-05-15 LAB — CBC WITH DIFFERENTIAL/PLATELET
Basophils Absolute: 0.1 10*3/uL (ref 0.0–0.2)
Basos: 1 %
EOS (ABSOLUTE): 0.1 10*3/uL (ref 0.0–0.4)
Eos: 2 %
Hematocrit: 47.2 % (ref 37.5–51.0)
Hemoglobin: 16.1 g/dL (ref 13.0–17.7)
Immature Grans (Abs): 0 10*3/uL (ref 0.0–0.1)
Immature Granulocytes: 0 %
Lymphocytes Absolute: 1.9 10*3/uL (ref 0.7–3.1)
Lymphs: 26 %
MCH: 31.5 pg (ref 26.6–33.0)
MCHC: 34.1 g/dL (ref 31.5–35.7)
MCV: 92 fL (ref 79–97)
Monocytes Absolute: 0.6 10*3/uL (ref 0.1–0.9)
Monocytes: 8 %
Neutrophils Absolute: 4.5 10*3/uL (ref 1.4–7.0)
Neutrophils: 63 %
Platelets: 215 10*3/uL (ref 150–450)
RBC: 5.11 x10E6/uL (ref 4.14–5.80)
RDW: 12.2 % (ref 11.6–15.4)
WBC: 7.1 10*3/uL (ref 3.4–10.8)

## 2021-05-15 LAB — PSA, TOTAL AND FREE
PSA, Free Pct: 22.6 %
PSA, Free: 0.43 ng/mL
Prostate Specific Ag, Serum: 1.9 ng/mL (ref 0.0–4.0)

## 2021-05-24 ENCOUNTER — Other Ambulatory Visit: Payer: Self-pay | Admitting: Nurse Practitioner

## 2021-05-28 ENCOUNTER — Telehealth: Payer: Self-pay | Admitting: *Deleted

## 2021-05-28 NOTE — Telephone Encounter (Signed)
PRIOR AUTH COMPLETED

## 2021-05-28 NOTE — Telephone Encounter (Signed)
Key: [id number] - PA Case ID: [id number] Drug Mesalamine ER 0.375GM er capsules Form [employer] Electronic PA Form (2017 NCPDP) Sent to Plan Today

## 2021-05-31 NOTE — Telephone Encounter (Signed)
Approved on January 6 Your request has been approved  [facility] aware

## 2021-06-01 MED ORDER — MESALAMINE ER 0.375 G PO CP24: 1.5000 g | ORAL_CAPSULE | Freq: Every day | ORAL | 5 refills | Status: DC

## 2021-06-01 NOTE — Addendum Note (Signed)
Addended by: Antonietta Barcelona D on: 06/01/2021 09:43 AM   Modules accepted: Orders

## 2021-06-01 NOTE — Telephone Encounter (Signed)
Refill failed. resent

## 2021-06-03 ENCOUNTER — Encounter: Payer: Self-pay | Admitting: *Deleted

## 2021-06-04 ENCOUNTER — Encounter: Payer: Self-pay | Admitting: Nurse Practitioner

## 2021-06-04 ENCOUNTER — Ambulatory Visit (INDEPENDENT_AMBULATORY_CARE_PROVIDER_SITE_OTHER): Payer: Medicare HMO | Admitting: Nurse Practitioner

## 2021-06-04 VITALS — BP 116/72 | HR 51 | Temp 98.4°F | Resp 20 | Ht 76.0 in | Wt 205.0 lb

## 2021-06-04 DIAGNOSIS — Z0000 Encounter for general adult medical examination without abnormal findings: Secondary | ICD-10-CM

## 2021-06-04 NOTE — Patient Instructions (Signed)
Exercising to Stay Healthy °To become healthy and stay healthy, it is recommended that you do moderate-intensity and vigorous-intensity exercise. You can tell that you are exercising at a moderate intensity if your heart starts beating faster and you start breathing faster but can still hold a conversation. You can tell that you are exercising at a vigorous intensity if you are breathing much harder and faster and cannot hold a conversation while exercising. °How can exercise benefit me? °Exercising regularly is important. It has many health benefits, such as: °Improving overall fitness, flexibility, and endurance. °Increasing bone density. °Helping with weight control. °Decreasing body fat. °Increasing muscle strength and endurance. °Reducing stress and tension, anxiety, depression, or anger. °Improving overall health. °What guidelines should I follow while exercising? °Before you start a new exercise program, talk with your health care provider. °Do not exercise so much that you hurt yourself, feel dizzy, or get very short of breath. °Wear comfortable clothes and wear shoes with good support. °Drink plenty of water while you exercise to prevent dehydration or heat stroke. °Work out until your breathing and your heartbeat get faster (moderate intensity). °How often should I exercise? °Choose an activity that you enjoy, and set realistic goals. Your health care provider can help you make an activity plan that is individually designed and works best for you. °Exercise regularly as told by your health care provider. This may include: °Doing strength training two times a week, such as: °Lifting weights. °Using resistance bands. °Push-ups. °Sit-ups. °Yoga. °Doing a certain intensity of exercise for a given amount of time. Choose from these options: °A total of 150 minutes of moderate-intensity exercise every week. °A total of 75 minutes of vigorous-intensity exercise every week. °A mix of moderate-intensity and  vigorous-intensity exercise every week. °Children, pregnant women, people who have not exercised regularly, people who are overweight, and older adults may need to talk with a health care provider about what activities are safe to perform. If you have a medical condition, be sure to talk with your health care provider before you start a new exercise program. °What are some exercise ideas? °Moderate-intensity exercise ideas include: °Walking 1 mile (1.6 km) in about 15 minutes. °Biking. °Hiking. °Golfing. °Dancing. °Water aerobics. °Vigorous-intensity exercise ideas include: °Walking 4.5 miles (7.2 km) or more in about 1 hour. °Jogging or running 5 miles (8 km) in about 1 hour. °Biking 10 miles (16.1 km) or more in about 1 hour. °Lap swimming. °Roller-skating or in-line skating. °Cross-country skiing. °Vigorous competitive sports, such as football, basketball, and soccer. °Jumping rope. °Aerobic dancing. °What are some everyday activities that can help me get exercise? °Yard work, such as: °Pushing a lawn mower. °Raking and bagging leaves. °Washing your car. °Pushing a stroller. °Shoveling snow. °Gardening. °Washing windows or floors. °How can I be more active in my day-to-day activities? °Use stairs instead of an elevator. °Take a walk during your lunch break. °If you drive, park your car farther away from your work or school. °If you take public transportation, get off one stop early and walk the rest of the way. °Stand up or walk around during all of your indoor phone calls. °Get up, stretch, and walk around every 30 minutes throughout the day. °Enjoy exercise with a friend. Support to continue exercising will help you keep a regular routine of activity. °Where to find more information °You can find more information about exercising to stay healthy from: °U.S. Department of Health and Human Services: www.hhs.gov °Centers for Disease Control and Prevention (  CDC): www.cdc.gov °Summary °Exercising regularly is  important. It will improve your overall fitness, flexibility, and endurance. °Regular exercise will also improve your overall health. It can help you control your weight, reduce stress, and improve your bone density. °Do not exercise so much that you hurt yourself, feel dizzy, or get very short of breath. °Before you start a new exercise program, talk with your health care provider. °This information is not intended to replace advice given to you by your health care provider. Make sure you discuss any questions you have with your health care provider. °Document Revised: 09/04/2020 Document Reviewed: 09/04/2020 °Elsevier Patient Education © 2022 [employer] Inc. ° °

## 2021-06-04 NOTE — Progress Notes (Signed)
Subjective:   Kyle Sexton is a 66 y.o. male who presents for a Welcome to Medicare exam.   Review of Systems: Review of Systems  Constitutional:  Negative for diaphoresis and weight loss.  Eyes:  Negative for blurred vision, double vision and pain.  Respiratory:  Negative for shortness of breath.   Cardiovascular:  Negative for chest pain, palpitations, orthopnea and leg swelling.  Gastrointestinal:  Negative for abdominal pain.  Skin:  Negative for rash.  Neurological:  Negative for dizziness, sensory change, loss of consciousness, weakness and headaches.  Endo/Heme/Allergies:  Negative for polydipsia. Does not bruise/bleed easily.  Psychiatric/Behavioral:  Negative for memory loss. The patient does not have insomnia.   All other systems reviewed and are negative.  Cardiac Risk Factors include: advanced age (>65mn, >>44women);male gender;smoking/ tobacco exposure     Objective:    Today's Vitals   06/04/21 1111  BP: 116/72  Pulse: (!) 51  Resp: 20  Temp: 98.4 F (36.9 C)  TempSrc: Temporal  SpO2: 97%  Weight: 205 lb (93 kg)  Height: 6' 4"  (1.93 m)   Body mass index is 24.95 kg/m.  Medications Outpatient Encounter Medications as of 06/04/2021  Medication Sig   celecoxib (CELEBREX) 200 MG capsule TAKE 1 CAPSULE BY MOUTH TWICE A DAY   ciclopirox (PENLAC) 8 % solution Apply topically at bedtime. Apply over nail and surrounding skin. Apply daily over previous coat. After seven (7) days, may remove with alcohol and continue cycle.   mesalamine (APRISO) 0.375 g 24 hr capsule Take 4 capsules (1.5 g total) by mouth daily.   tadalafil (CIALIS) 20 MG tablet TAKE 0.5-1 TABLETS (10-20 MG TOTAL) BY MOUTH EVERY OTHER DAY AS NEEDED FOR ERECTILE DYSFUNCTION.   No facility-administered encounter medications on file as of 06/04/2021.     History: Past Medical History:  Diagnosis Date   Hyperlipidemia    Ulcerative proctocolitis without complication ([facility]    Past Surgical  History:  Procedure Laterality Date   ARGON LASER APPLICATION  044/31/5400  Procedure: ARGON LASER APPLICATION;  Surgeon: RDaneil Dolin MD;  [facility];  Service: Endoscopy;;   BIOPSY  11/26/2020   Procedure: BIOPSY;  Surgeon: RDaneil Dolin MD;  [facility];  Service: Endoscopy;;   CATARACT EXTRACTION Bilateral    COLONOSCOPY  05/23/2005   Dr. RGala Romney diminutive rectal polyp and 2X2 cm complex, carpet-like polyp, removed via piecemeal fashion, with remaining tissue ablated with APC. fragments of tubular adenoma   COLONOSCOPY N/A 11/06/2012   RQQP:YPPJsided proctocolitis. Colonic diverticulosis   COLONOSCOPY WITH PROPOFOL N/A 11/26/2020   Procedure: COLONOSCOPY WITH PROPOFOL;  Surgeon: RDaneil Dolin MD;  [facility];  Service: Endoscopy;  Laterality: N/A;  1:45pm   KNEE SURGERY     both   POLYPECTOMY  11/26/2020   Procedure: POLYPECTOMY;  Surgeon: RDaneil Dolin MD;  [facility];  Service: Endoscopy;;    Family History  Problem Relation Age of Onset   Diabetes Mother    Crohn's disease Son    Colon cancer Neg Hx    Social History   Occupational History   Not on file  Tobacco Use   Smoking status: Former   Smokeless tobacco: Current    Types: CNurse, children'sUse: Never used  Substance and Sexual Activity   Alcohol use: Yes    Comment: 2 beers on occasion   Drug use: No   Sexual  activity: Not on file   Tobacco Counseling Not a smoker  Immunizations and Health Maintenance Immunization History  Administered Date(s) Administered   Influenza, Seasonal, Injecte, Preservative Fre 02/21/2014   Moderna Sars-Covid-2 Vaccination 09/17/2019, 10/15/2019   Tdap 08/28/2013   There are no preventive care reminders to display for this patient.  Activities of Daily Living In your present state of health, do you have any difficulty performing the following activities: 06/04/2021  Hearing? N  Vision? N  Difficulty  concentrating or making decisions? N  Walking or climbing stairs? N  Dressing or bathing? N  Doing errands, shopping? N  Preparing Food and eating ? N  Using the Toilet? N  In the past six months, have you accidently leaked urine? N  Do you have problems with loss of bowel control? N  Managing your Medications? N  Managing your Finances? N  Housekeeping or managing your Housekeeping? N  Some recent data might be hidden    Physical Exam  n/a(optional), or other factors deemed appropriate based on the beneficiary's medical and social history and current clinical standards.  Advanced Directives: Does Patient Have a Medical Advance Directive?: Yes Type of Advance Directive: Living will    Assessment:    This is a routine wellness  examination for this patient . Welcome to medicare  Vision/Hearing screen No results found.  Dietary issues and exercise activities discussed:  Current Exercise Habits: The patient does not participate in regular exercise at present, Exercise limited by: None identified   Goals      DIET - EAT MORE FRUITS AND VEGETABLES     Exercise 150 min/wk Moderate Activity        Depression Screen PHQ 2/9 Scores 06/04/2021 05/14/2021 12/04/2019 11/28/2016  PHQ - 2 Score 0 0 0 0  PHQ- 9 Score - 0 - -     Fall Risk Fall Risk  06/04/2021  Falls in the past year? 0  Number falls in past yr: -  Injury with Fall? -    Cognitive Function MMSE - Mini Mental State Exam 06/04/2021  Orientation to time 5  Orientation to Place 5  Registration 3  Attention/ Calculation 5  Recall 3  Language- name 2 objects 2  Language- repeat 1  Language- follow 3 step command 3  Language- read & follow direction 1  Write a sentence 1  Copy design 1  Total score 30        Patient Care Team: Chevis Pretty, FNP as PCP - General (Nurse Practitioner) Daneil Dolin, MD as Attending Physician (Gastroenterology)     Plan:   Welcome to medicare  I have personally  reviewed and noted the following in the patients chart:   Medical and social history Use of alcohol, tobacco or illicit drugs  Current medications and supplements Functional ability and status Nutritional status Physical activity Advanced directives List of other physicians Hospitalizations, surgeries, and ER visits in previous 12 months Vitals Screenings to include cognitive, depression, and falls Referrals and appointments  In addition, I have reviewed and discussed with patient certain preventive protocols, quality metrics, and best practice recommendations. A written personalized care plan for preventive services as well as general preventive health recommendations were provided to patient.    [place], [place] 06/04/2021

## 2021-08-18 ENCOUNTER — Other Ambulatory Visit: Payer: Self-pay | Admitting: Nurse Practitioner

## 2021-08-18 DIAGNOSIS — M19049 Primary osteoarthritis, unspecified hand: Secondary | ICD-10-CM

## 2021-11-23 ENCOUNTER — Other Ambulatory Visit: Payer: Self-pay | Admitting: Nurse Practitioner

## 2021-11-23 DIAGNOSIS — M19049 Primary osteoarthritis, unspecified hand: Secondary | ICD-10-CM

## 2021-12-07 ENCOUNTER — Telehealth: Payer: Self-pay | Admitting: Internal Medicine

## 2021-12-07 ENCOUNTER — Encounter: Payer: Self-pay | Admitting: *Deleted

## 2021-12-07 ENCOUNTER — Encounter (INDEPENDENT_AMBULATORY_CARE_PROVIDER_SITE_OTHER): Payer: Self-pay | Admitting: *Deleted

## 2021-12-07 NOTE — Telephone Encounter (Signed)
TCS recall letter has been mailed to patient

## 2021-12-07 NOTE — Telephone Encounter (Signed)
Patient called and said he is due for another tcs.  Said he received a questionairre but can not find it.  Can you please send him one or does he just need an office visit?

## 2021-12-08 ENCOUNTER — Other Ambulatory Visit: Payer: Self-pay | Admitting: Nurse Practitioner

## 2021-12-08 MED ORDER — SILDENAFIL CITRATE 20 MG PO TABS: 20.0000 mg | ORAL_TABLET | Freq: Three times a day (TID) | ORAL | 6 refills | Status: DC

## 2021-12-27 ENCOUNTER — Encounter: Payer: Self-pay | Admitting: *Deleted

## 2021-12-27 NOTE — Patient Instructions (Signed)
  Procedure: colonoscopy  Estimated body mass index is 24.95 kg/m as calculated from the following:   Height as of this encounter: 6' 4"  (1.93 m).   Weight as of this encounter: 205 lb (93 kg).   Have you had a colonoscopy before?  11/26/20, Dr.Rourk  Do you have family history of colon cancer  No  Do you have a family history of polyps? Yes  Previous colonoscopy with polyps removed? Yes, 2022  Do you have a history colorectal cancer?   No  Are you diabetic?  No  Do you have a prosthetic or mechanical heart valve? No  Do you have a pacemaker/defibrillator?   No  Have you had endocarditis/atrial fibrillation?  No  Do you use supplemental oxygen/CPAP?  No  Have you had joint replacement within the last 12 months?  No  Do you tend to be constipated or have to use laxatives?  No   Do you have history of alcohol use? If yes, how much and how often.  Yes, daily  Do you have history or are you using drugs? If yes, what do are you  using?  No  Have you ever had a stroke/heart attack?  No  Have you ever had a heart or other vascular stent placed,?No  Do you take weight loss medication? No  Do you take any blood-thinning medications such as: (Plavix, aspirin, Coumadin, Aggrenox, Brilinta, Xarelto, Eliquis, Pradaxa, Savaysa or Effient) No  If yes we need the name, milligram, dosage and who is prescribing doctor:  N/A             Current Outpatient Medications  Medication Sig Dispense Refill   celecoxib (CELEBREX) 200 MG capsule TAKE 1 CAPSULE BY MOUTH TWICE A DAY 60 capsule 0   mesalamine (APRISO) 0.375 g 24 hr capsule Take 4 capsules (1.5 g total) by mouth daily. 120 capsule 5   No current facility-administered medications for this visit.    Allergies  Allergen Reactions   Bee Venom Anaphylaxis

## 2021-12-29 ENCOUNTER — Other Ambulatory Visit: Payer: Self-pay | Admitting: Nurse Practitioner

## 2021-12-29 DIAGNOSIS — M19049 Primary osteoarthritis, unspecified hand: Secondary | ICD-10-CM

## 2021-12-30 ENCOUNTER — Telehealth: Payer: Self-pay | Admitting: *Deleted

## 2021-12-30 NOTE — Telephone Encounter (Signed)
Patient called and was asking about scheduling his colonoscopy. Patient informed that the form for triage colonoscopy has been forwarded to provider and once they look it over and send it back to scheduler, we will call to schedule his colonoscopy. Verbalized understanding.

## 2022-01-01 NOTE — Progress Notes (Signed)
Patient has history of UC and is overdue for OV. Needs OV prior to scheduling colonoscopy.   Manuela Schwartz: Please arrange OV with Roseanne Kaufman, NP who saw him last.

## 2022-01-03 NOTE — Progress Notes (Signed)
Please schedule. Thanks!

## 2022-01-04 NOTE — Progress Notes (Signed)
OV made °

## 2022-01-29 ENCOUNTER — Other Ambulatory Visit: Payer: Self-pay | Admitting: Nurse Practitioner

## 2022-01-29 DIAGNOSIS — M19049 Primary osteoarthritis, unspecified hand: Secondary | ICD-10-CM

## 2022-01-31 ENCOUNTER — Encounter: Payer: Self-pay | Admitting: Nurse Practitioner

## 2022-01-31 ENCOUNTER — Telehealth (INDEPENDENT_AMBULATORY_CARE_PROVIDER_SITE_OTHER): Payer: Medicare HMO | Admitting: Nurse Practitioner

## 2022-01-31 DIAGNOSIS — U071 COVID-19: Secondary | ICD-10-CM | POA: Diagnosis not present

## 2022-01-31 MED ORDER — MOLNUPIRAVIR EUA 200MG CAPSULE: 4.0000 | ORAL_CAPSULE | Freq: Two times a day (BID) | ORAL | 0 refills | Status: AC

## 2022-01-31 NOTE — Patient Instructions (Signed)

## 2022-01-31 NOTE — Progress Notes (Signed)
Virtual Visit Consent   Kyle Sexton, you are scheduled for a virtual visit with Mary-Margaret Hassell Done, FNP, a [facility] provider, today.     Just as with appointments in the office, your consent must be obtained to participate.  Your consent will be active for this visit and any virtual visit you may have with one of our providers in the next 365 days.     If you have a MyChart account, a copy of this consent can be sent to you electronically.  All virtual visits are billed to your insurance company just like a traditional visit in the office.    As this is a virtual visit, video technology does not allow for your provider to perform a traditional examination.  This may limit your provider's ability to fully assess your condition.  If your provider identifies any concerns that need to be evaluated in person or the need to arrange testing (such as labs, EKG, etc.), we will make arrangements to do so.     Although advances in technology are sophisticated, we cannot ensure that it will always work on either your end or our end.  If the connection with a video visit is poor, the visit may have to be switched to a telephone visit.  With either a video or telephone visit, we are not always able to ensure that we have a secure connection.     I need to obtain your verbal consent now.   Are you willing to proceed with your visit today? YES   Meshulem Onorato Ferrell has provided verbal consent on 01/31/2022 for a virtual visit (video or telephone).   Mary-Margaret Hassell Done, FNP   Date: 01/31/2022 1:32 PM   Virtual Visit via Video Note   I, Mary-Margaret Adrea Sherpa, connected with Erika Hussar Swiderski ([id number], 20-Jun-1955) on 01/31/22 at  3:15 PM EDT by a video-enabled telemedicine application and verified that I am speaking with the correct person using two identifiers.  Location: Patient: Virtual Visit Location Patient: Home Provider: Virtual Visit Location Provider: Mobile   I discussed the limitations of  evaluation and management by telemedicine and the availability of in person appointments. The patient expressed understanding and agreed to proceed.    History of Present Illness: Kyle Sexton is a 66 y.o. who identifies as a male who was assigned male at birth, and is being seen today for covid positive.  HPI: URI  This is a new problem. The current episode started in the past 7 days (2 days ago). The problem has been waxing and waning. There has been no fever. Associated symptoms include congestion, coughing, rhinorrhea and a sore throat. Pertinent negatives include no headaches or sinus pain. He has tried acetaminophen for the symptoms. The treatment provided mild relief.    Review of Systems  HENT:  Positive for congestion, rhinorrhea and sore throat. Negative for sinus pain.   Respiratory:  Positive for cough.   Neurological:  Negative for headaches.    Problems:  Patient Active Problem List   Diagnosis Date Noted   History of colonic polyps 09/29/2020   Hyperlipidemia 10/24/2012   BPH (benign prostatic hyperplasia) 10/24/2012    Allergies:  Allergies  Allergen Reactions   Bee Venom Anaphylaxis   Medications:  Current Outpatient Medications:    celecoxib (CELEBREX) 200 MG capsule, Take 1 capsule (200 mg total) by mouth 2 (two) times daily. (NEEDS TO BE SEEN BEFORE NEXT REFILL), Disp: 60 capsule, Rfl: 0   mesalamine (APRISO) 0.375  g 24 hr capsule, Take 4 capsules (1.5 g total) by mouth daily., Disp: 120 capsule, Rfl: 5  Observations/Objective: Patient is well-developed, well-nourished in no acute distress.  Resting comfortably  at home.  Head is normocephalic, atraumatic.  No labored breathing.  Speech is clear and coherent with logical content.  Patient is alert and oriented at baseline.  Raspy voice  Assessment and Plan:  Tremon Sainvil [place] in today with chief complaint of No chief complaint on file.   1. Positive self-administered antigen test for COVID-19 1. Take  meds as prescribed 2. Use a cool mist humidifier especially during the winter months and when heat has been humid. 3. Use saline nose sprays frequently 4. Saline irrigations of the nose can be very helpful if done frequently.  * 4X daily for 1 week*  * Use of a nettie pot can be helpful with this. Follow directions with this* 5. Drink plenty of fluids 6. Keep thermostat turn down low 7.For any cough or congestion- delsym 8. For fever or aces or pains- take tylenol or ibuprofen appropriate for age and weight.  * for fevers greater than 101 orally you may alternate ibuprofen and tylenol every  3 hours.   Meds ordered this encounter  Medications   molnupiravir EUA (LAGEVRIO) 200 mg CAPS capsule    Sig: Take 4 capsules (800 mg total) by mouth 2 (two) times daily for 5 days.    Dispense:  40 capsule    Refill:  0    Order Specific Question:   Supervising Provider    Answer:   Caryl Pina A [[id number]]      Follow Up Instructions: I discussed the assessment and treatment plan with the patient. The patient was provided an opportunity to ask questions and all were answered. The patient agreed with the plan and demonstrated an understanding of the instructions.  A copy of instructions were sent to the patient via MyChart.  The patient was advised to call back or seek an in-person evaluation if the symptoms worsen or if the condition fails to improve as anticipated.  Time:  I spent 7 minutes with the patient via telehealth technology discussing the above problems/concerns.    Mary-Margaret Hassell Done, FNP

## 2022-02-08 ENCOUNTER — Encounter: Payer: Self-pay | Admitting: Gastroenterology

## 2022-02-08 ENCOUNTER — Ambulatory Visit (INDEPENDENT_AMBULATORY_CARE_PROVIDER_SITE_OTHER): Payer: Medicare HMO | Admitting: Gastroenterology

## 2022-02-08 VITALS — BP 112/72 | HR 60 | Temp 97.3°F | Ht 76.0 in | Wt 204.8 lb

## 2022-02-08 DIAGNOSIS — K5180 Other ulcerative colitis without complications: Secondary | ICD-10-CM

## 2022-02-08 DIAGNOSIS — Z86010 Personal history of colonic polyps: Secondary | ICD-10-CM | POA: Diagnosis not present

## 2022-02-08 DIAGNOSIS — K5190 Ulcerative colitis, unspecified, without complications: Secondary | ICD-10-CM | POA: Insufficient documentation

## 2022-02-08 NOTE — Patient Instructions (Signed)
We are arranging a colonoscopy in the near future!  Please call us with any concerns!  I enjoyed seeing you again today! As you know, I value our relationship and want to provide genuine, compassionate, and quality care. I welcome your feedback. If you receive a survey regarding your visit,  I greatly appreciate you taking time to fill this out. See you next time!  Annitta Needs, PhD, ANP-BC [facility] Gastroenterology

## 2022-02-08 NOTE — Progress Notes (Signed)
Gastroenterology Office Note     Primary Care Physician:  Chevis Pretty, FNP  Primary Gastroenterologist: Dr. Gala Romney    Chief Complaint   Chief Complaint  Patient presents with   Colonoscopy    1 year colonoscopy due to poor prep     History of Present Illness   Kyle Sexton is a 66 y.o. male presenting today in follow-up with a history of left-sided proctocolitis, diagnosed in June 2014. Last colonoscopy in July 2022 with diffusely abnormal mild changes of IBD/UC s/p segmental biopsy. Multiple clusters of hyperplastic appearing polyps throughout colon most likely representing pseudopolyps s/p biopsy. One 4X5 carpet polyp in cecum at IC valve. Second 1 cm polyp removed via piecemeal fashion. Path with quiescent colitis. Tubular adenomas. Overdue for 6 month follow-up.   He is taking generic Apriso 1.5 grams daily. Clinically in remission. No abdominal pain, N/V, changes in bowel habits, constipation, diarrhea, overt GI bleeding, GERD, dysphagia, unexplained weight loss, lack of appetite, unexplained weight gain.      Past Medical History:  Diagnosis Date   Hyperlipidemia    Ulcerative proctocolitis without complication [facility])     Past Surgical History:  Procedure Laterality Date   ARGON LASER APPLICATION  00/92/3300   Procedure: ARGON LASER APPLICATION;  Surgeon: Daneil Dolin, MD;  [facility];  Service: Endoscopy;;   BIOPSY  11/26/2020   Procedure: BIOPSY;  Surgeon: Daneil Dolin, MD;  [facility];  Service: Endoscopy;;   CATARACT EXTRACTION Bilateral    COLONOSCOPY  05/23/2005   Dr. Gala Romney: diminutive rectal polyp and 2X2 cm complex, carpet-like polyp, removed via piecemeal fashion, with remaining tissue ablated with APC. fragments of tubular adenoma   COLONOSCOPY N/A 11/06/2012   TMA:UQJF sided proctocolitis. Colonic diverticulosis   COLONOSCOPY WITH PROPOFOL N/A 11/26/2020   Procedure: COLONOSCOPY WITH PROPOFOL;  Surgeon: Daneil Dolin, MD;  [facility];  Service: Endoscopy;  Laterality: N/A;  1:45pm   KNEE SURGERY     both   POLYPECTOMY  11/26/2020   Procedure: POLYPECTOMY;  Surgeon: Daneil Dolin, MD;  [facility];  Service: Endoscopy;;    Current Outpatient Medications  Medication Sig Dispense Refill   sildenafil (REVATIO) 20 MG tablet Take 20 mg by mouth 3 (three) times daily.     celecoxib (CELEBREX) 200 MG capsule Take 1 capsule (200 mg total) by mouth 2 (two) times daily. (NEEDS TO BE SEEN BEFORE NEXT REFILL) 60 capsule 0   mesalamine (APRISO) 0.375 g 24 hr capsule Take 4 capsules (1.5 g total) by mouth daily. 120 capsule 5   No current facility-administered medications for this visit.    Allergies as of 02/08/2022 - Review Complete 02/08/2022  Allergen Reaction Noted   Bee venom Anaphylaxis 10/30/2012    Family History  Problem Relation Age of Onset   Diabetes Mother    Crohn's disease Son    Colon cancer Neg Hx     Social History   Socioeconomic History   Marital status: Married    Spouse name: Not on file   Number of children: 2   Years of education: Not on file   Highest education level: Associate degree: occupational, Hotel manager, or vocational program  Occupational History   Not on file  Tobacco Use   Smoking status: Former   Smokeless tobacco: Current    Types: Nurse, children's Use: Never used  Substance and Sexual Activity   Alcohol  use: Yes    Comment: 2 beers on occasion   Drug use: No   Sexual activity: Not on file  Other Topics Concern   Not on file  Social History Narrative   Not on file   Social Determinants of Health   Financial Resource Strain: Not on file  Food Insecurity: Not on file  Transportation Needs: Not on file  Physical Activity: Not on file  Stress: Not on file  Social Connections: Not on file  Intimate Partner Violence: Not on file     Review of Systems   Gen: Denies any fever, chills, fatigue, weight  loss, lack of appetite.  CV: Denies chest pain, heart palpitations, peripheral edema, syncope.  Resp: Denies shortness of breath at rest or with exertion. Denies wheezing or cough.  GI: Denies dysphagia or odynophagia. Denies jaundice, hematemesis, fecal incontinence. GU : Denies urinary burning, urinary frequency, urinary hesitancy MS: Denies joint pain, muscle weakness, cramps, or limitation of movement.  Derm: Denies rash, itching, dry skin Psych: Denies depression, anxiety, memory loss, and confusion Heme: Denies bruising, bleeding, and enlarged lymph nodes.   Physical Exam   BP 112/72   Pulse 60   Temp (!) 97.3 F (36.3 C)   Ht 6' 4"  (1.93 m)   Wt 204 lb 12.8 oz (92.9 kg)   BMI 24.93 kg/m  General:   Alert and oriented. Pleasant and cooperative. Well-nourished and well-developed.  Head:  Normocephalic and atraumatic. Eyes:  Without icterus Abdomen:  +BS, soft, non-tender and non-distended. No HSM noted. No guarding or rebound. No masses appreciated.  Rectal:  Deferred  Msk:  Symmetrical without gross deformities. Normal posture. Extremities:  Without edema. Neurologic:  Alert and  oriented x4;  grossly normal neurologically. Skin:  Intact without significant lesions or rashes. Psych:  Alert and cooperative. Normal mood and affect.  Lab Results  Component Value Date   CREATININE 0.95 05/14/2021   BUN 18 05/14/2021   NA 141 05/14/2021   K 4.4 05/14/2021   CL 103 05/14/2021   CO2 27 05/14/2021   Lab Results  Component Value Date   ALT 24 05/14/2021   AST 23 05/14/2021   ALKPHOS 61 05/14/2021   BILITOT 0.5 05/14/2021      Assessment   Kyle Sexton is a 66 y.o. male presenting today in follow-up with a history of left-sided proctocolitis, diagnosed in June 2014. Last colonoscopy in July 2022 with diffusely abnormal mild changes of IBD/UC s/p segmental biopsy. Multiple clusters of hyperplastic appearing polyps throughout colon most likely representing  pseudopolyps s/p biopsy. One 4X5 carpet polyp in cecum at IC valve. Second 1 cm polyp removed via piecemeal fashion. Path with quiescent colitis. Tubular adenomas. Overdue for 6 month follow-up.   Clinically in remission at this time on mesalamine 1.5 grams daily. Renal function normal. He has no concerns today. We discussed the need for early interval colonoscopy, and he is agreeable.     PLAN    Continue current regimen Proceed with colonoscopy by Dr. Gala Romney in near future: the risks, benefits, and alternatives have been discussed with the patient in detail. The patient states understanding and desires to proceed. Further recommendations to follow    Annitta Needs, PhD, ANP-BC [facility] Gastroenterology

## 2022-02-12 ENCOUNTER — Other Ambulatory Visit: Payer: Self-pay | Admitting: Nurse Practitioner

## 2022-02-23 ENCOUNTER — Telehealth: Payer: Self-pay | Admitting: *Deleted

## 2022-02-23 NOTE — Telephone Encounter (Signed)
LMOVM to call back to schedule TCS with Dr. Gala Romney, ASA 2

## 2022-02-24 ENCOUNTER — Encounter: Payer: Self-pay | Admitting: *Deleted

## 2022-02-24 MED ORDER — PEG 3350-KCL-NA BICARB-NACL 420 G PO SOLR: 4000.0000 mL | Freq: Once | ORAL | 0 refills | Status: AC

## 2022-02-24 NOTE — Telephone Encounter (Signed)
Pt scheduled for 110/6/23 at 10:45 am. Instructions mailed and prep has been sent to the pharmacy

## 2022-03-28 ENCOUNTER — Ambulatory Visit (HOSPITAL_COMMUNITY)
Admission: RE | Admit: 2022-03-28 | Discharge: 2022-03-28 | Disposition: A | Discharge status: Home / Self Care | Payer: Medicare HMO | Source: Ambulatory Visit | Attending: Internal Medicine | Admitting: Internal Medicine

## 2022-03-28 ENCOUNTER — Encounter (HOSPITAL_COMMUNITY): Payer: Self-pay | Admitting: Internal Medicine

## 2022-03-28 ENCOUNTER — Other Ambulatory Visit: Payer: Self-pay

## 2022-03-28 ENCOUNTER — Encounter (HOSPITAL_COMMUNITY)
Admission: RE | Disposition: A | Discharge status: Home / Self Care | Payer: Self-pay | Source: Ambulatory Visit | Attending: Internal Medicine

## 2022-03-28 ENCOUNTER — Other Ambulatory Visit: Payer: Self-pay | Admitting: Nurse Practitioner

## 2022-03-28 ENCOUNTER — Ambulatory Visit (HOSPITAL_COMMUNITY): Payer: Medicare HMO | Admitting: Anesthesiology

## 2022-03-28 ENCOUNTER — Ambulatory Visit (HOSPITAL_BASED_OUTPATIENT_CLINIC_OR_DEPARTMENT_OTHER): Payer: Medicare HMO | Admitting: Anesthesiology

## 2022-03-28 DIAGNOSIS — Z87891 Personal history of nicotine dependence: Secondary | ICD-10-CM | POA: Insufficient documentation

## 2022-03-28 DIAGNOSIS — K5730 Diverticulosis of large intestine without perforation or abscess without bleeding: Secondary | ICD-10-CM | POA: Insufficient documentation

## 2022-03-28 DIAGNOSIS — K635 Polyp of colon: Secondary | ICD-10-CM | POA: Diagnosis not present

## 2022-03-28 DIAGNOSIS — K6289 Other specified diseases of anus and rectum: Secondary | ICD-10-CM | POA: Diagnosis not present

## 2022-03-28 DIAGNOSIS — K529 Noninfective gastroenteritis and colitis, unspecified: Secondary | ICD-10-CM

## 2022-03-28 DIAGNOSIS — K5190 Ulcerative colitis, unspecified, without complications: Secondary | ICD-10-CM

## 2022-03-28 DIAGNOSIS — D120 Benign neoplasm of cecum: Secondary | ICD-10-CM | POA: Diagnosis not present

## 2022-03-28 DIAGNOSIS — Z09 Encounter for follow-up examination after completed treatment for conditions other than malignant neoplasm: Secondary | ICD-10-CM | POA: Diagnosis not present

## 2022-03-28 DIAGNOSIS — Z86010 Personal history of colonic polyps: Secondary | ICD-10-CM | POA: Diagnosis not present

## 2022-03-28 DIAGNOSIS — Z1211 Encounter for screening for malignant neoplasm of colon: Secondary | ICD-10-CM | POA: Insufficient documentation

## 2022-03-28 DIAGNOSIS — D128 Benign neoplasm of rectum: Secondary | ICD-10-CM

## 2022-03-28 DIAGNOSIS — M19049 Primary osteoarthritis, unspecified hand: Secondary | ICD-10-CM

## 2022-03-28 HISTORY — PX: POLYPECTOMY: SHX5525

## 2022-03-28 HISTORY — PX: BIOPSY: SHX5522

## 2022-03-28 HISTORY — PX: COLONOSCOPY WITH PROPOFOL: SHX5780

## 2022-03-28 SURGERY — COLONOSCOPY WITH PROPOFOL
Anesthesia: General

## 2022-03-28 MED ORDER — PROPOFOL 500 MG/50ML IV EMUL
INTRAVENOUS | Status: DC | PRN
  Administered 2022-03-28: 150 ug/kg/min via INTRAVENOUS

## 2022-03-28 MED ORDER — EPHEDRINE SULFATE (PRESSORS) 50 MG/ML IJ SOLN
INTRAMUSCULAR | Status: DC | PRN
  Administered 2022-03-28 (×2): 5 mg via INTRAVENOUS

## 2022-03-28 MED ORDER — LIDOCAINE HCL (CARDIAC) PF 100 MG/5ML IV SOSY
PREFILLED_SYRINGE | INTRAVENOUS | Status: DC | PRN
  Administered 2022-03-28: 50 mg via INTRAVENOUS

## 2022-03-28 MED ORDER — PROPOFOL 10 MG/ML IV BOLUS
INTRAVENOUS | Status: DC | PRN
  Administered 2022-03-28: 100 mg via INTRAVENOUS
  Administered 2022-03-28: 30 mg via INTRAVENOUS

## 2022-03-28 MED ORDER — LACTATED RINGERS IV SOLN
INTRAVENOUS | Status: DC
  Administered 2022-03-28: 1000 mL via INTRAVENOUS

## 2022-03-28 NOTE — Transfer of Care (Signed)
Immediate Anesthesia Transfer of Care Note  Patient: Kyle Sexton  Procedure(s) Performed: COLONOSCOPY WITH PROPOFOL BIOPSY POLYPECTOMY  Patient Location: Endoscopy Unit  Anesthesia Type:General  Level of Consciousness: awake and alert   Airway & Oxygen Therapy: Patient Spontanous Breathing  Post-op Assessment: Report given to RN and Post -op Vital signs reviewed and stable  Post vital signs: Reviewed and stable  Last Vitals:  Vitals Value Taken Time  BP 99/63 03/28/22 1057  Temp 36.4 C 03/28/22 1053  Pulse 75 03/28/22 1057  Resp 14 03/28/22 1057  SpO2 94 % 03/28/22 1057    Last Pain:  Vitals:   03/28/22 1057  TempSrc:   PainSc: 0-No pain      Patients Stated Pain Goal: 7 (16/43/53 9122)  Complications: No notable events documented.

## 2022-03-28 NOTE — Op Note (Signed)
[facility] Patient Name: Kyle Sexton Procedure Date: 03/28/2022 9:47 AM MRN: [id number] Date of Birth: 1955-10-28 Attending MD: Norvel Richards , MD, [id number] CSN: [id number] Age: 66 Admit Type: Outpatient Procedure:                Colonoscopy Indications:              High risk colon cancer surveillance: Personal                            history of colonic polyps Providers:                Norvel Richards, MD, Caprice Kluver, Aram Candela Referring MD:              Medicines:                Propofol per Anesthesia Complications:            No immediate complications. Estimated Blood Loss:     Estimated blood loss was minimal. Procedure:                Pre-Anesthesia Assessment:                           - Prior to the procedure, a History and Physical                            was performed, and patient medications and                            allergies were reviewed. The patient's tolerance of                            previous anesthesia was also reviewed. The risks                            and benefits of the procedure and the sedation                            options and risks were discussed with the patient.                            All questions were answered, and informed consent                            was obtained. Prior Anticoagulants: The patient has                            taken no anticoagulant or antiplatelet agents. ASA                            Grade Assessment: II - A patient with mild systemic                            disease. After reviewing the risks and benefits,  the patient was deemed in satisfactory condition to                            undergo the procedure.                           After obtaining informed consent, the colonoscope                            was passed under direct vision. Throughout the                            procedure, the patient's blood pressure, pulse, and                             oxygen saturations were monitored continuously. The                            [id number]) scope was introduced through                            the anus and advanced to the 5 cm into the ileum.                            The colonoscopy was performed without difficulty.                            The patient tolerated the procedure well. The                            quality of the bowel preparation was adequate. The                            entire colon was well visualized. The terminal                            ileum, ileocecal valve, appendiceal orifice, and                            rectum were photographed. Scope In: 10:24:09 AM Scope Out: 10:50:27 AM Scope Withdrawal Time: 0 hours 20 minutes 52 seconds  Total Procedure Duration: 0 hours 26 minutes 18 seconds  Findings:      The perianal and digital rectal examinations were normal.      A 7 mm polyp was found in the cecum. The polyp was sessile. The polyp       was removed with a hot snare. Resection and retrieval were complete.       Estimated blood loss: none.      A 5 mm polyp was found in the rectum. The polyp was sessile. The polyp       was removed with a cold snare. Resection and retrieval were complete.       Estimated blood loss was minimal. Dozens of 1 to 3 mm "pseudopolyps"       scattered about the ascending and transverse colon. A cluster  of them       were biopsied in the ascending segment. There was patchy erythema of the       transverse and descending segments. This extensive segment middle       biopsies of the ascending, transverse, descending, sigmoid and rectal       mucosa were biopsied for histologic study. Distal 5 cm of terminal ileum       appeared normal. Scattered left-sided diverticula. Impression:               - One 7 mm polyp in the cecum, removed with a hot                            snare. Resected and retrieved.                           - One 5 mm polyp in the rectum,  removed with a cold                            snare. Resected and retrieved. Left-sided                            diverticulosis.                           -Patchy erythema of the transverse and descending                            segments. Numerous pseudopolyps throughout the                            ascending and transverse segment status post                            biopsy. Status post segmental biopsy of the entire                            colon. Normal-appearing terminal ileum. Moderate Sedation:      Moderate (conscious) sedation was personally administered by an       anesthesia professional. The following parameters were monitored: oxygen       saturation, heart rate, blood pressure, and response to care. Recommendation:           - Patient has a contact number available for                            emergencies. The signs and symptoms of potential                            delayed complications were discussed with the                            patient. Return to normal activities tomorrow.                            Written discharge instructions were provided to the  patient.                           - Advance diet as tolerated.                           - Repeat colonoscopy date to be determined after                            pending pathology results are reviewed for                            surveillance.                           - Return to GI office in 4 months. Procedure Code(s):        --- Professional ---                           [phone], Colonoscopy, flexible; with removal of                            tumor(s), polyp(s), or other lesion(s) by snare                            technique Diagnosis Code(s):        --- Professional ---                           Z86.010, Personal history of colonic polyps                           D12.0, Benign neoplasm of cecum                           D12.8, Benign neoplasm of rectum CPT  copyright 2022 American Medical Association. All rights reserved. The codes documented in this report are preliminary and upon coder review may  be revised to meet current compliance requirements. Cristopher Estimable. Iliyah Bui, MD Norvel Richards, MD 03/28/2022 11:02:21 AM This report has been signed electronically. Number of Addenda: 0

## 2022-03-28 NOTE — Anesthesia Postprocedure Evaluation (Signed)
Anesthesia Post Note  Patient: Kyle Sexton  Procedure(s) Performed: COLONOSCOPY WITH PROPOFOL BIOPSY POLYPECTOMY  Patient location during evaluation: Phase II Anesthesia Type: General Level of consciousness: awake and alert and oriented Pain management: pain level controlled Vital Signs Assessment: post-procedure vital signs reviewed and stable Respiratory status: spontaneous breathing, nonlabored ventilation and respiratory function stable Cardiovascular status: blood pressure returned to baseline and stable Postop Assessment: no apparent nausea or vomiting Anesthetic complications: no  No notable events documented.   Last Vitals:  Vitals:   03/28/22 1053 03/28/22 1057  BP:  99/63  Pulse: 82 75  Resp: 16 14  Temp: 36.4 C   SpO2: 94% 94%    Last Pain:  Vitals:   03/28/22 1057  TempSrc:   PainSc: 0-No pain                 Yasheka Fossett C Virgene Tirone

## 2022-03-28 NOTE — H&P (Signed)
@LOGO @   Primary Care Physician:  Chevis Pretty, FNP Primary Gastroenterologist:  Dr. Gala Romney  Pre-Procedure History & Physical: HPI:  Kyle Sexton is a 66 y.o. male here for   Surveillance colonoscopy.  History large carpet polyp removed from the cecum 1 year ago.  Here for early surveillance.  Past Medical History:  Diagnosis Date   Hyperlipidemia    Ulcerative proctocolitis without complication [facility])     Past Surgical History:  Procedure Laterality Date   ARGON LASER APPLICATION  63/14/9702   Procedure: ARGON LASER APPLICATION;  Surgeon: Daneil Dolin, MD;  Location: [facility];  Service: Endoscopy;;   BIOPSY  11/26/2020   Procedure: BIOPSY;  Surgeon: Daneil Dolin, MD;  Location: [facility];  Service: Endoscopy;;   CATARACT EXTRACTION Bilateral    COLONOSCOPY  05/23/2005   Dr. Gala Romney: diminutive rectal polyp and 2X2 cm complex, carpet-like polyp, removed via piecemeal fashion, with remaining tissue ablated with APC. fragments of tubular adenoma   COLONOSCOPY N/A 11/06/2012   OVZ:CHYI sided proctocolitis. Colonic diverticulosis   COLONOSCOPY WITH PROPOFOL N/A 11/26/2020   diffusely abnormal mild changes of IBD/UC s/p segmental biopsy. Multiple clusters of hyperplastic appearing polyps throughout colon most likely representing pseudopolyps s/p biopsy. One 4X5 carpet polyp in cecum at IC valve. Second 1 cm polyp removed via piecemeal fashion. Path with quiescent colitis. Tubular adenomas. Overdue for 6 month follow-up.   KNEE SURGERY     both   POLYPECTOMY  11/26/2020   Procedure: POLYPECTOMY;  Surgeon: Daneil Dolin, MD;  Location: [facility];  Service: Endoscopy;;    Prior to Admission medications   Medication Sig Start Date End Date Taking? Authorizing Provider  celecoxib (CELEBREX) 200 MG capsule Take 1 capsule (200 mg total) by mouth 2 (two) times daily. (NEEDS TO BE SEEN BEFORE NEXT REFILL) 12/29/21  Yes Hassell Done, Mary-Margaret, FNP  mesalamine (APRISO)  0.375 g 24 hr capsule Take 1.5 g by mouth daily. Take 4 capsules in morning, (1.5 grams)   Yes [provider]  sildenafil (REVATIO) 20 MG tablet TAKE ONE (1) TABLET BY MOUTH 3 TIMES DAILY Patient taking differently: Take 20 mg by mouth 3 (three) times daily as needed (erectile dysfunction). 02/14/22  Yes Hassell Done, Mary-Margaret, FNP    Allergies as of 02/24/2022 - Review Complete 02/08/2022  Allergen Reaction Noted   Bee venom Anaphylaxis 10/30/2012    Family History  Problem Relation Age of Onset   Diabetes Mother    Crohn's disease Son    Colon cancer Neg Hx     Social History   Socioeconomic History   Marital status: Married    Spouse name: Not on file   Number of children: 2   Years of education: Not on file   Highest education level: Associate degree: occupational, Hotel manager, or vocational program  Occupational History   Not on file  Tobacco Use   Smoking status: Former   Smokeless tobacco: Current    Types: Nurse, children's Use: Never used  Substance and Sexual Activity   Alcohol use: Yes    Comment: 2 beers on occasion   Drug use: No   Sexual activity: Not on file  Other Topics Concern   Not on file  Social History Narrative   Not on file   Social Determinants of Health   Financial Resource Strain: Not on file  Food Insecurity: Not on file  Transportation Needs: Not on file  Physical Activity: Not on  file  Stress: Not on file  Social Connections: Not on file  Intimate Partner Violence: Not on file    Review of Systems: See HPI, otherwise negative ROS  Physical Exam: BP 113/78   Temp 98.7 F (37.1 C) (Oral)   Resp 15   Ht 6' 4"  (1.93 m)   Wt 93 kg   SpO2 97%   BMI 24.95 kg/m  General:   Alert,  Well-developed, well-nourished, pleasant and cooperative in NAD No significant cervical adenopathy. Lungs:  Clear throughout to auscultation.   No wheezes, crackles, or rhonchi. No acute distress. Heart:  Regular rate and rhythm; no  murmurs, clicks, rubs,  or gallops. Abdomen: Non-distended, normal bowel sounds.  Soft and nontender without appreciable mass or hepatosplenomegaly.  Pulses:  Normal pulses noted. Extremities:  Without clubbing or edema.  Impression/Plan:    66 year old gentleman with history of left-sided proctocolitis significant carpet polyp removed from the cecum last year.  He is somewhat overdue for early surveillance.  Clinically, doing well.   I have offered him a surveillance colonoscopy today per plan.  The risks, benefits, limitations, alternatives and imponderables have been reviewed with the patient. Questions have been answered. All parties are agreeable.       Notice: This dictation was prepared with Dragon dictation along with smaller phrase technology. Any transcriptional errors that result from this process are unintentional and may not be corrected upon review.

## 2022-03-28 NOTE — Discharge Instructions (Signed)
  Colonoscopy Discharge Instructions  Read the instructions outlined below and refer to this sheet in the next few weeks. These discharge instructions provide you with general information on caring for yourself after you leave the hospital. Your doctor may also give you specific instructions. While your treatment has been planned according to the most current medical practices available, unavoidable complications occasionally occur. If you have any problems or questions after discharge, call Dr. Gala Romney at [phone]. ACTIVITY You may resume your regular activity, but move at a slower pace for the next 24 hours.  Take frequent rest periods for the next 24 hours.  Walking will help get rid of the air and reduce the bloated feeling in your belly (abdomen).  No driving for 24 hours (because of the medicine (anesthesia) used during the test).   Do not sign any important legal documents or operate any machinery for 24 hours (because of the anesthesia used during the test).  NUTRITION Drink plenty of fluids.  You may resume your normal diet as instructed by your doctor.  Begin with a light meal and progress to your normal diet. Heavy or fried foods are harder to digest and may make you feel sick to your stomach (nauseated).  Avoid alcoholic beverages for 24 hours or as instructed.  MEDICATIONS You may resume your normal medications unless your doctor tells you otherwise.  WHAT YOU CAN EXPECT TODAY Some feelings of bloating in the abdomen.  Passage of more gas than usual.  Spotting of blood in your stool or on the toilet paper.  IF YOU HAD POLYPS REMOVED DURING THE COLONOSCOPY: No aspirin products for 7 days or as instructed.  No alcohol for 7 days or as instructed.  Eat a soft diet for the next 24 hours.  FINDING OUT THE RESULTS OF YOUR TEST Not all test results are available during your visit. If your test results are not back during the visit, make an appointment with your caregiver to find out the  results. Do not assume everything is normal if you have not heard from your caregiver or the medical facility. It is important for you to follow up on all of your test results.  SEEK IMMEDIATE MEDICAL ATTENTION IF: You have more than a spotting of blood in your stool.  Your belly is swollen (abdominal distention).  You are nauseated or vomiting.  You have a temperature over 101.  You have abdominal pain or discomfort that is severe or gets worse throughout the day.      colon polyp information provided-2 polyps removed from your colon    multiple other biopsies taken throughout your colon   further recommendations to follow pending review of pathology report   at patient request, I called Susie at [phone] discussed findings and recommendations

## 2022-03-28 NOTE — Anesthesia Preprocedure Evaluation (Addendum)
Anesthesia Evaluation  Patient identified by MRN, date of birth, ID band Patient awake    Reviewed: Allergy & Precautions, H&P , NPO status , Patient's Chart, lab work & pertinent test results  Airway Mallampati: II  TM Distance: >3 FB Neck ROM: Full    Dental  (+) Dental Advisory Given, Missing   Pulmonary former smoker   Pulmonary exam normal breath sounds clear to auscultation       Cardiovascular negative cardio ROS Normal cardiovascular exam Rhythm:Regular Rate:Normal     Neuro/Psych negative neurological ROS  negative psych ROS   GI/Hepatic Neg liver ROS, PUD, Bowel prep,,,Ulcerative proctocolitis   Endo/Other  negative endocrine ROS    Renal/GU negative Renal ROS  negative genitourinary   Musculoskeletal negative musculoskeletal ROS (+)    Abdominal   Peds negative pediatric ROS (+)  Hematology negative hematology ROS (+)   Anesthesia Other Findings   Reproductive/Obstetrics negative OB ROS                             Anesthesia Physical Anesthesia Plan  ASA: 2  Anesthesia Plan: General   Post-op Pain Management: Minimal or no pain anticipated   Induction: Intravenous  PONV Risk Score and Plan: 1 and Propofol infusion  Airway Management Planned: Nasal Cannula and Natural Airway  Additional Equipment:   Intra-op Plan:   Post-operative Plan:   Informed Consent: I have reviewed the patients History and Physical, chart, labs and discussed the procedure including the risks, benefits and alternatives for the proposed anesthesia with the patient or authorized representative who has indicated his/her understanding and acceptance.     Dental advisory given  Plan Discussed with: CRNA and Surgeon  Anesthesia Plan Comments:        Anesthesia Quick Evaluation

## 2022-03-29 ENCOUNTER — Encounter: Payer: Self-pay | Admitting: Nurse Practitioner

## 2022-03-29 IMAGING — DX DG CHEST 2V
2 series · 2 of 2 positions shown · non-contrast
Comparison: 10/23/2015

CLINICAL DATA: Screening

EXAM:
CHEST - 2 VIEW

[chest pa]
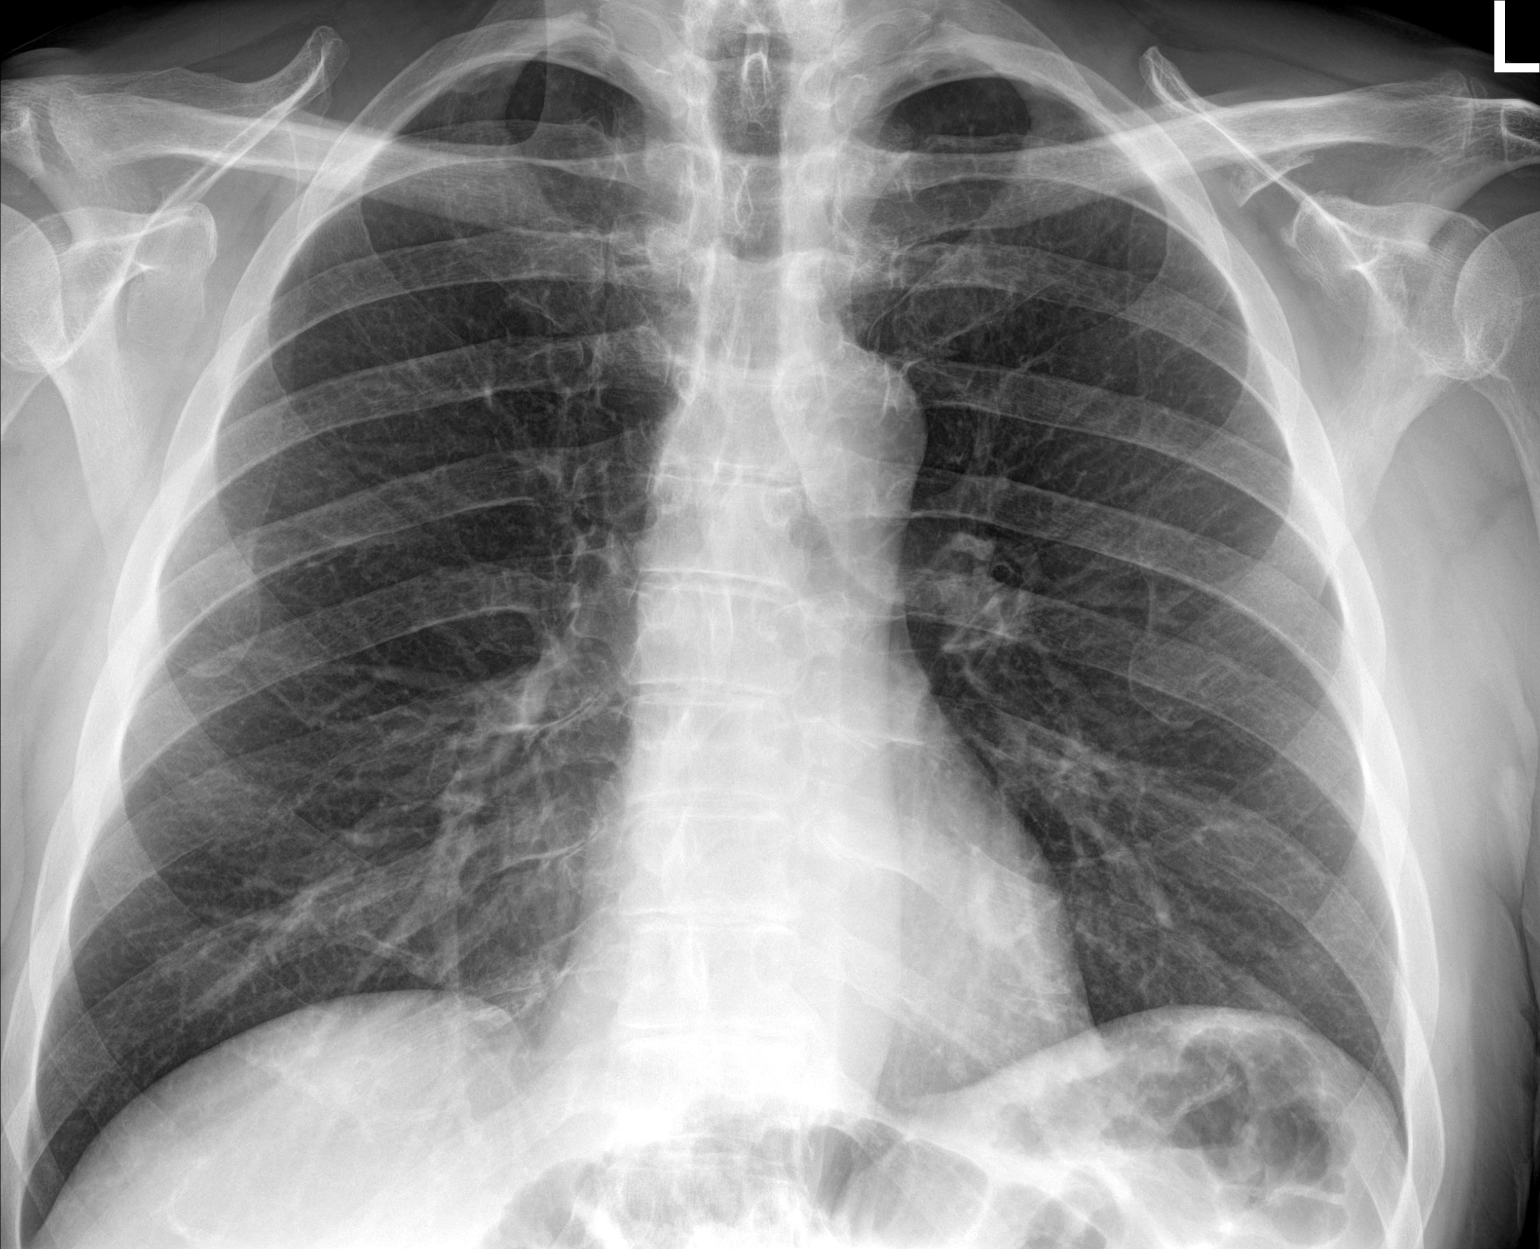

[chest lat]
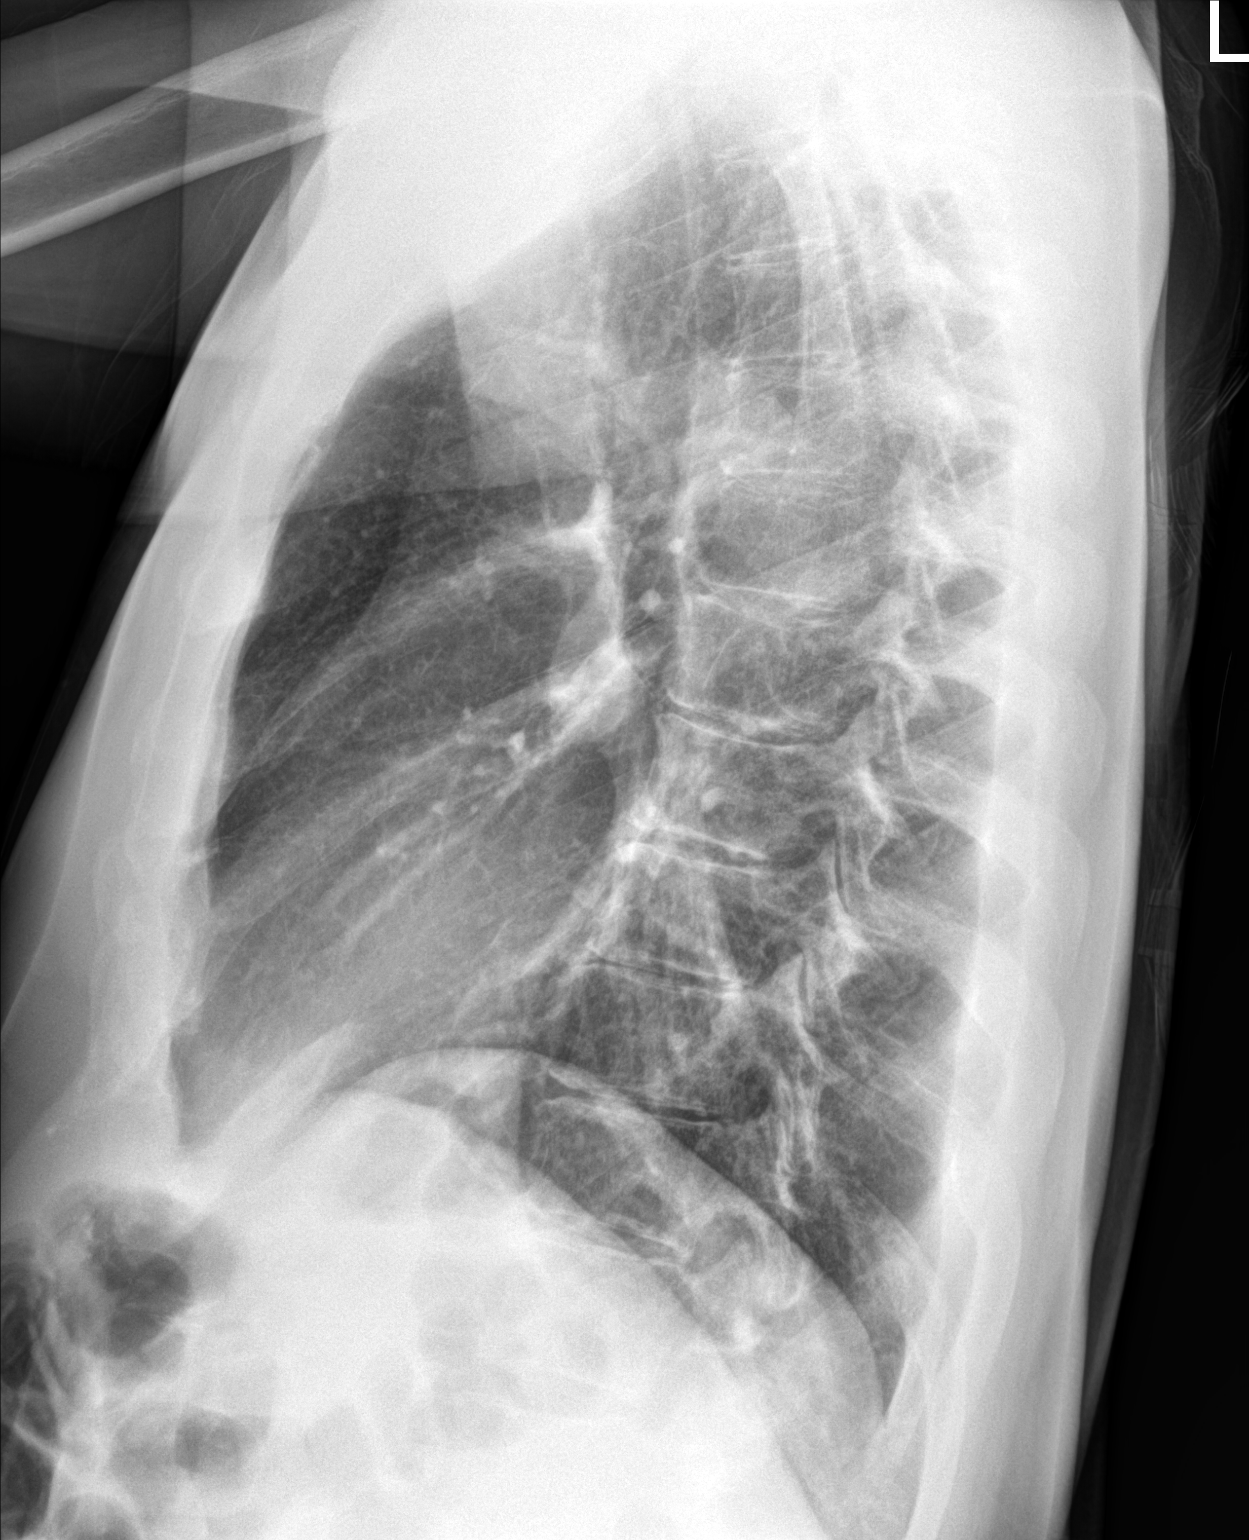

[2 of 2 positions shown; findings below may reference images not displayed]

FINDINGS: The heart size and mediastinal contours are within normal limits.
Both lungs are clear. The visualized skeletal structures are
unremarkable.
IMPRESSION: Normal study.

## 2022-03-29 NOTE — Telephone Encounter (Signed)
Letter sent.

## 2022-03-29 NOTE — Telephone Encounter (Signed)
MMM NTBS 30 days given 12/29/21

## 2022-03-30 ENCOUNTER — Encounter: Payer: Self-pay | Admitting: Internal Medicine

## 2022-03-30 LAB — SURGICAL PATHOLOGY

## 2022-04-01 ENCOUNTER — Encounter (HOSPITAL_COMMUNITY): Payer: Self-pay | Admitting: Internal Medicine

## 2022-04-04 ENCOUNTER — Other Ambulatory Visit: Payer: Self-pay | Admitting: Nurse Practitioner

## 2022-04-04 DIAGNOSIS — M19049 Primary osteoarthritis, unspecified hand: Secondary | ICD-10-CM

## 2022-04-04 MED ORDER — CELECOXIB 200 MG PO CAPS: 200.0000 mg | ORAL_CAPSULE | Freq: Two times a day (BID) | ORAL | 0 refills | Status: DC

## 2022-04-04 NOTE — Progress Notes (Signed)
Celebrex refilled

## 2022-04-18 ENCOUNTER — Other Ambulatory Visit: Payer: Self-pay | Admitting: Nurse Practitioner

## 2022-04-27 ENCOUNTER — Other Ambulatory Visit: Payer: Self-pay | Admitting: Nurse Practitioner

## 2022-04-27 DIAGNOSIS — M19049 Primary osteoarthritis, unspecified hand: Secondary | ICD-10-CM

## 2022-04-27 NOTE — Telephone Encounter (Signed)
MMM NTBS 30 days given 04/04/22

## 2022-04-27 NOTE — Telephone Encounter (Signed)
Called pt to schedule appt, stated he had gotten a refill on medication and did not need an appt

## 2022-05-04 ENCOUNTER — Telehealth: Payer: Self-pay | Admitting: Nurse Practitioner

## 2022-05-04 DIAGNOSIS — M19049 Primary osteoarthritis, unspecified hand: Secondary | ICD-10-CM

## 2022-05-04 NOTE — Telephone Encounter (Signed)
Spoke with patient to schedule AWVI.   He asked if someone would contact him in reference to his refill request for celecoxib (CELEBREX) 200 MG capsule. He said that the pharmacy told him that he will need to get approval or authorization before they could fill it?

## 2022-05-04 NOTE — Telephone Encounter (Signed)
Will you ok a refill

## 2022-05-05 MED ORDER — CELECOXIB 200 MG PO CAPS: 200.0000 mg | ORAL_CAPSULE | Freq: Two times a day (BID) | ORAL | 1 refills | Status: DC

## 2022-05-25 ENCOUNTER — Telehealth: Payer: Self-pay | Admitting: *Deleted

## 2022-05-25 NOTE — Telephone Encounter (Signed)
Pt called about a bill he received regarding his colonoscopy that was done in November. He says he doesn't understand why it is so much when he was told he would only have to a minimal amount.   Gave pt the preservice center's number to call and talk with them.

## 2022-06-12 ENCOUNTER — Other Ambulatory Visit: Payer: Self-pay | Admitting: Nurse Practitioner

## 2022-06-12 DIAGNOSIS — M19049 Primary osteoarthritis, unspecified hand: Secondary | ICD-10-CM

## 2022-06-13 ENCOUNTER — Telehealth: Payer: Self-pay

## 2022-06-13 NOTE — Telephone Encounter (Signed)
Pt is requesting refills on his mesalamine, pt was last seen in ov on  02/08/2022. Pt is requesting that this be sent to [facility] in [place].

## 2022-06-14 MED ORDER — MESALAMINE ER 0.375 G PO CP24: 1.5000 g | ORAL_CAPSULE | Freq: Every day | ORAL | 11 refills | Status: DC

## 2022-06-14 NOTE — Telephone Encounter (Signed)
Completed.

## 2022-06-14 NOTE — Addendum Note (Signed)
Addended by: Annitta Needs on: 06/14/2022 11:12 AM   Modules accepted: Orders

## 2022-06-26 ENCOUNTER — Other Ambulatory Visit: Payer: Self-pay | Admitting: Nurse Practitioner

## 2022-07-01 ENCOUNTER — Encounter: Payer: Self-pay | Admitting: Nurse Practitioner

## 2022-07-01 ENCOUNTER — Telehealth (INDEPENDENT_AMBULATORY_CARE_PROVIDER_SITE_OTHER): Payer: Medicare HMO | Admitting: Nurse Practitioner

## 2022-07-01 DIAGNOSIS — J0111 Acute recurrent frontal sinusitis: Secondary | ICD-10-CM | POA: Diagnosis not present

## 2022-07-01 MED ORDER — CHLORPHEN-PE-ACETAMINOPHEN 4-10-325 MG PO TABS: 1.0000 | ORAL_TABLET | Freq: Four times a day (QID) | ORAL | 0 refills | Status: DC | PRN

## 2022-07-01 MED ORDER — AMOXICILLIN-POT CLAVULANATE 875-125 MG PO TABS: 1.0000 | ORAL_TABLET | Freq: Two times a day (BID) | ORAL | 0 refills | Status: DC

## 2022-07-01 NOTE — Patient Instructions (Signed)

## 2022-07-01 NOTE — Progress Notes (Signed)
Virtual Visit Consent   Kyle Sexton, you are scheduled for a virtual visit with Mary-Margaret Hassell Done, FNP, a [facility] provider, today.     Just as with appointments in the office, your consent must be obtained to participate.  Your consent will be active for this visit and any virtual visit you may have with one of our providers in the next 365 days.     If you have a MyChart account, a copy of this consent can be sent to you electronically.  All virtual visits are billed to your insurance company just like a traditional visit in the office.    As this is a virtual visit, video technology does not allow for your provider to perform a traditional examination.  This may limit your provider's ability to fully assess your condition.  If your provider identifies any concerns that need to be evaluated in person or the need to arrange testing (such as labs, EKG, etc.), we will make arrangements to do so.     Although advances in technology are sophisticated, we cannot ensure that it will always work on either your end or our end.  If the connection with a video visit is poor, the visit may have to be switched to a telephone visit.  With either a video or telephone visit, we are not always able to ensure that we have a secure connection.     I need to obtain your verbal consent now.   Are you willing to proceed with your visit today? YES   Kyle Sexton has provided verbal consent on 07/01/2022 for a virtual visit (video or telephone).   Mary-Margaret Hassell Done, FNP   Date: 07/01/2022 11:19 AM   Virtual Visit via Video Note   I, Mary-Margaret Hassell Done, connected with Kyle Sexton ([id number], [birth year]) on 07/01/22 at 10:45 AM EST by a video-enabled telemedicine application and verified that I am speaking with the correct person using two identifiers.  Location: Patient: Virtual Visit Location Patient: Home Provider: Virtual Visit Location Provider: Mobile   I discussed the limitations of  evaluation and management by telemedicine and the availability of in person appointments. The patient expressed understanding and agreed to proceed.    History of Present Illness: Kyle Sexton is a 67 y.o. who identifies as a male who was assigned male at birth, and is being seen today for sinusitis .  HPI: Patient has a long issue with sinus infections.  Sinusitis This is a new problem. The current episode started in the past 7 days. The problem has been waxing and waning since onset. The pain is mild. Associated symptoms include congestion, headaches and sinus pressure. Past treatments include acetaminophen. The treatment provided mild relief.    Review of Systems  HENT:  Positive for congestion and sinus pressure.   Neurological:  Positive for headaches.    Problems:  Patient Active Problem List   Diagnosis Date Noted   UC (ulcerative colitis) ([place]) 02/08/2022   History of colonic polyps 09/29/2020   Hyperlipidemia 10/24/2012   BPH (benign prostatic hyperplasia) 10/24/2012    Allergies:  Allergies  Allergen Reactions   Bee Venom Anaphylaxis   Medications:  Current Outpatient Medications:    celecoxib (CELEBREX) 200 MG capsule, Take 1 capsule (200 mg total) by mouth 2 (two) times daily. (NEEDS TO BE SEEN BEFORE NEXT REFILL), Disp: 60 capsule, Rfl: 1   mesalamine (APRISO) 0.375 g 24 hr capsule, Take 4 capsules (1.5 g total) by mouth daily.  Take 4 capsules in morning, (1.5 grams), Disp: 120 capsule, Rfl: 11   sildenafil (REVATIO) 20 MG tablet, TAKE ONE (1) TABLET BY MOUTH 3 TIMES DAILY, Disp: 30 tablet, Rfl: 5  Observations/Objective: Patient is well-developed, well-nourished in no acute distress.  Resting comfortably  at home.  Head is normocephalic, atraumatic.  No labored breathing.  Speech is clear and coherent with logical content.  Patient is alert and oriented at baseline.  Raspy voice Frontal sinus pressure on palpation  Assessment and Plan:  Kyle Sexton in  today with chief complaint of Sinusitis   1. Acute recurrent frontal sinusitis 1. Take meds as prescribed 2. Use a cool mist humidifier especially during the winter months and when heat has been humid. 3. Use saline nose sprays frequently 4. Saline irrigations of the nose can be very helpful if done frequently.  * 4X daily for 1 week*  * Use of a nettie pot can be helpful with this. Follow directions with this* 5. Drink plenty of fluids 6. Keep thermostat turn down low 7.For any cough or congestion- delsym if needed 8. For fever or aces or pains- take tylenol or ibuprofen appropriate for age and weight.  * for fevers greater than 101 orally you may alternate ibuprofen and tylenol every  3 hours.    - amoxicillin-clavulanate (AUGMENTIN) 875-125 MG tablet; Take 1 tablet by mouth 2 (two) times daily.  Dispense: 20 tablet; Refill: 0 - Chlorphen-PE-Acetaminophen 4-10-325 MG TABS; Take 1 tablet by mouth every 6 (six) hours as needed.  Dispense: 20 tablet; Refill: 0    The above assessment and management plan was discussed with the patient. The patient verbalized understanding of and has agreed to the management plan. Patient is aware to call the clinic if symptoms persist or worsen. Patient is aware when to return to the clinic for a follow-up visit. Patient educated on when it is appropriate to go to the emergency department.   Mary-Margaret Hassell Done, FNP    Follow Up Instructions: I discussed the assessment and treatment plan with the patient. The patient was provided an opportunity to ask questions and all were answered. The patient agreed with the plan and demonstrated an understanding of the instructions.  A copy of instructions were sent to the patient via MyChart.  The patient was advised to call back or seek an in-person evaluation if the symptoms worsen or if the condition fails to improve as anticipated.  Time:  I spent 12 minutes with the patient via telehealth technology  discussing the above problems/concerns.    Mary-Margaret Hassell Done, FNP

## 2022-07-07 ENCOUNTER — Other Ambulatory Visit: Payer: Self-pay | Admitting: Nurse Practitioner

## 2022-07-07 MED ORDER — FLUTICASONE PROPIONATE 50 MCG/ACT NA SUSP: 2.0000 | Freq: Every day | NASAL | 6 refills | Status: DC

## 2022-07-14 ENCOUNTER — Other Ambulatory Visit: Payer: Self-pay | Admitting: Nurse Practitioner

## 2022-07-14 DIAGNOSIS — J329 Chronic sinusitis, unspecified: Secondary | ICD-10-CM

## 2022-07-15 ENCOUNTER — Other Ambulatory Visit: Payer: Self-pay | Admitting: Nurse Practitioner

## 2022-07-15 DIAGNOSIS — M19049 Primary osteoarthritis, unspecified hand: Secondary | ICD-10-CM

## 2022-07-15 NOTE — Telephone Encounter (Signed)
MMM NTBS 30 days given 06/01/22

## 2022-07-18 MED ORDER — CELECOXIB 200 MG PO CAPS: 200.0000 mg | ORAL_CAPSULE | Freq: Two times a day (BID) | ORAL | 0 refills | Status: DC

## 2022-07-18 NOTE — Addendum Note (Signed)
Addended by: Antonietta Barcelona D on: 07/18/2022 10:32 AM   Modules accepted: Orders

## 2022-07-18 NOTE — Telephone Encounter (Signed)
Made appt 07/22/22 with mmm

## 2022-07-22 ENCOUNTER — Ambulatory Visit (INDEPENDENT_AMBULATORY_CARE_PROVIDER_SITE_OTHER): Payer: Medicare HMO | Admitting: Nurse Practitioner

## 2022-07-22 ENCOUNTER — Encounter: Payer: Self-pay | Admitting: Nurse Practitioner

## 2022-07-22 ENCOUNTER — Ambulatory Visit (INDEPENDENT_AMBULATORY_CARE_PROVIDER_SITE_OTHER): Payer: Medicare HMO

## 2022-07-22 VITALS — BP 124/79 | HR 62 | Temp 98.0°F | Resp 20 | Ht 76.0 in | Wt 207.0 lb

## 2022-07-22 DIAGNOSIS — E782 Mixed hyperlipidemia: Secondary | ICD-10-CM

## 2022-07-22 DIAGNOSIS — Z0001 Encounter for general adult medical examination with abnormal findings: Secondary | ICD-10-CM

## 2022-07-22 DIAGNOSIS — Z0000 Encounter for general adult medical examination without abnormal findings: Secondary | ICD-10-CM

## 2022-07-22 DIAGNOSIS — K5180 Other ulcerative colitis without complications: Secondary | ICD-10-CM | POA: Diagnosis not present

## 2022-07-22 DIAGNOSIS — N400 Enlarged prostate without lower urinary tract symptoms: Secondary | ICD-10-CM | POA: Diagnosis not present

## 2022-07-22 DIAGNOSIS — Z0389 Encounter for observation for other suspected diseases and conditions ruled out: Secondary | ICD-10-CM | POA: Diagnosis not present

## 2022-07-22 DIAGNOSIS — Z23 Encounter for immunization: Secondary | ICD-10-CM

## 2022-07-22 DIAGNOSIS — E785 Hyperlipidemia, unspecified: Secondary | ICD-10-CM

## 2022-07-22 DIAGNOSIS — L989 Disorder of the skin and subcutaneous tissue, unspecified: Secondary | ICD-10-CM | POA: Diagnosis not present

## 2022-07-22 NOTE — Progress Notes (Signed)
Subjective:    Patient ID: Kyle Sexton, male    DOB: 04-20-1956, 67 y.o.   MRN: [id number]   Chief Complaint: annual physical    HPI:  Kyle Sexton is a 67 y.o. who identifies as a male who was assigned male at birth.   Social history: Lives with: by hisself Work history: Engineering geologist   Comes in today for follow up of the following chronic medical issues:  1. Annual physical exam  2. Mixed hyperlipidemia Does watch diet and stays very active Lab Results  Component Value Date   CHOL 170 05/14/2021   HDL 65 05/14/2021   LDLCALC 95 05/14/2021   TRIG 49 05/14/2021   CHOLHDL 2.6 05/14/2021     3. Other ulcerative colitis without complication (HCC) No recent flare ups  4. Benign prostatic hyperplasia without lower urinary tract symptoms No voiding issues   New complaints: Has had chronic sinus congestion and ENT referral has been made. He is using flonase nasal spray  Allergies  Allergen Reactions   Bee Venom Anaphylaxis   Outpatient Encounter Medications as of 07/22/2022  Medication Sig   amoxicillin-clavulanate (AUGMENTIN) 875-125 MG tablet Take 1 tablet by mouth 2 (two) times daily.   celecoxib (CELEBREX) 200 MG capsule Take 1 capsule (200 mg total) by mouth 2 (two) times daily.   Chlorphen-PE-Acetaminophen 4-10-325 MG TABS Take 1 tablet by mouth every 6 (six) hours as needed.   fluticasone (FLONASE) 50 MCG/ACT nasal spray Place 2 sprays into both nostrils daily.   mesalamine (APRISO) 0.375 g 24 hr capsule Take 4 capsules (1.5 g total) by mouth daily. Take 4 capsules in morning, (1.5 grams)   sildenafil (REVATIO) 20 MG tablet TAKE ONE (1) TABLET BY MOUTH 3 TIMES DAILY   No facility-administered encounter medications on file as of 07/22/2022.    Past Surgical History:  Procedure Laterality Date   ARGON LASER APPLICATION  [id number]   Procedure: ARGON LASER APPLICATION;  Surgeon: Daneil Dolin, MD;  [facility];  Service: Endoscopy;;    BIOPSY  11/26/2020   Procedure: BIOPSY;  Surgeon: Daneil Dolin, MD;  [facility];  Service: Endoscopy;;   BIOPSY  03/28/2022   Procedure: BIOPSY;  Surgeon: Daneil Dolin, MD;  [facility];  Service: Endoscopy;;   CATARACT EXTRACTION Bilateral    COLONOSCOPY  05/23/2005   Dr. Gala Romney: diminutive rectal polyp and 2X2 cm complex, carpet-like polyp, removed via piecemeal fashion, with remaining tissue ablated with APC. fragments of tubular adenoma   COLONOSCOPY N/A 11/06/2012   [id number] sided proctocolitis. Colonic diverticulosis   COLONOSCOPY WITH PROPOFOL N/A 11/26/2020   diffusely abnormal mild changes of IBD/UC s/p segmental biopsy. Multiple clusters of hyperplastic appearing polyps throughout colon most likely representing pseudopolyps s/p biopsy. One 4X5 carpet polyp in cecum at IC valve. Second 1 cm polyp removed via piecemeal fashion. Path with quiescent colitis. Tubular adenomas. Overdue for 6 month follow-up.   COLONOSCOPY WITH PROPOFOL N/A 03/28/2022   Procedure: COLONOSCOPY WITH PROPOFOL;  Surgeon: Daneil Dolin, MD;  [facility];  Service: Endoscopy;  Laterality: N/A;  10:45 AM   KNEE SURGERY     both   POLYPECTOMY  11/26/2020   Procedure: POLYPECTOMY;  Surgeon: Daneil Dolin, MD;  [facility];  Service: Endoscopy;;   POLYPECTOMY  03/28/2022   Procedure: POLYPECTOMY;  Surgeon: Daneil Dolin, MD;  [facility];  Service: Endoscopy;;    Family History  Problem Relation Age of Onset   Diabetes Mother    Crohn's disease Son    Colon cancer Neg Hx       Controlled substance contract: n/a     Review of Systems  Constitutional:  Negative for diaphoresis.  Eyes:  Negative for pain.  Respiratory:  Negative for shortness of breath.   Cardiovascular:  Negative for chest pain, palpitations and leg swelling.  Gastrointestinal:  Negative for abdominal pain.  Endocrine: Negative for polydipsia.  Skin:  Negative for  rash.  Neurological:  Negative for dizziness, weakness and headaches.  Hematological:  Does not bruise/bleed easily.  All other systems reviewed and are negative.      Objective:   Physical Exam Vitals and nursing note reviewed.  Constitutional:      Appearance: Normal appearance. He is well-developed.  HENT:     Head: Normocephalic.     Nose: Nose normal.     Mouth/Throat:     Mouth: Mucous membranes are moist.     Pharynx: Oropharynx is clear.  Eyes:     Pupils: Pupils are equal, round, and reactive to light.  Neck:     Thyroid: No thyroid mass or thyromegaly.     Vascular: No carotid bruit or JVD.     Trachea: Phonation normal.  Cardiovascular:     Rate and Rhythm: Normal rate and regular rhythm.  Pulmonary:     Effort: Pulmonary effort is normal. No respiratory distress.     Breath sounds: Normal breath sounds.  Abdominal:     General: Bowel sounds are normal.     Palpations: Abdomen is soft.     Tenderness: There is no abdominal tenderness.  Musculoskeletal:        General: Normal range of motion.     Cervical back: Normal range of motion and neck supple.  Lymphadenopathy:     Cervical: No cervical adenopathy.  Skin:    General: Skin is warm and dry.  Neurological:     Mental Status: He is alert and oriented to person, place, and time.  Psychiatric:        Behavior: Behavior normal.        Thought Content: Thought content normal.        Judgment: Judgment normal.    BP 124/79   Pulse 62   Temp 98 F (36.7 C) (Temporal)   Resp 20   Ht '6\' 4"'$  (1.93 m)   Wt 207 lb (93.9 kg)   SpO2 97%   BMI 25.20 kg/m   EKG- NSR-Mary-Margaret Hassell Done, FNP       Assessment & Plan:  Otilio Carpen Huxford in today with chief complaint of Annual Exam   1. Annual physical exam - CBC with Differential/Platelet  2. Mixed hyperlipidemia Low fat diet - CMP14+EGFR - Lipid panel  3. Other ulcerative colitis without complication ([place])  4. Benign prostatic hyperplasia without  lower urinary tract symptoms Report any voiding issues - PSA, total and free  5. Skin lesion Referral to derm. - Ambulatory referral to Dermatology    The above assessment and management plan was discussed with the patient. The patient verbalized understanding of and has agreed to the management plan. Patient is aware to call the clinic if symptoms persist or worsen. Patient is aware when to return to the clinic for a follow-up visit. Patient educated on when it is appropriate to go to the emergency department.   Mary-Margaret Hassell Done, FNP

## 2022-07-23 LAB — CBC WITH DIFFERENTIAL/PLATELET
Basophils Absolute: 0.1 10*3/uL (ref 0.0–0.2)
Basos: 1 %
EOS (ABSOLUTE): 0.1 10*3/uL (ref 0.0–0.4)
Eos: 2 %
Hematocrit: 47.7 % (ref 37.5–51.0)
Hemoglobin: 15.8 g/dL (ref 13.0–17.7)
Immature Grans (Abs): 0 10*3/uL (ref 0.0–0.1)
Immature Granulocytes: 0 %
Lymphocytes Absolute: 1.4 10*3/uL (ref 0.7–3.1)
Lymphs: 26 %
MCH: 30.7 pg (ref 26.6–33.0)
MCHC: 33.1 g/dL (ref 31.5–35.7)
MCV: 93 fL (ref 79–97)
Monocytes Absolute: 0.6 10*3/uL (ref 0.1–0.9)
Monocytes: 11 %
Neutrophils Absolute: 3.1 10*3/uL (ref 1.4–7.0)
Neutrophils: 60 %
Platelets: 220 10*3/uL (ref 150–450)
RBC: 5.14 x10E6/uL (ref 4.14–5.80)
RDW: 12.7 % (ref 11.6–15.4)
WBC: 5.2 10*3/uL (ref 3.4–10.8)

## 2022-07-23 LAB — LIPID PANEL
Chol/HDL Ratio: 2.5 ratio (ref 0.0–5.0)
Cholesterol, Total: 158 mg/dL (ref 100–199)
HDL: 64 mg/dL (ref >39)
LDL Chol Calc (NIH): 85 mg/dL (ref 0–99)
Triglycerides: 43 mg/dL (ref 0–149)
VLDL Cholesterol Cal: 9 mg/dL (ref 5–40)

## 2022-07-23 LAB — CMP14+EGFR
ALT: 22 IU/L (ref 0–44)
AST: 25 IU/L (ref 0–40)
Albumin/Globulin Ratio: 2.2 (ref 1.2–2.2)
Albumin: 4.3 g/dL (ref 3.9–4.9)
Alkaline Phosphatase: 62 IU/L (ref 44–121)
BUN/Creatinine Ratio: 20 (ref 10–24)
BUN: 20 mg/dL (ref 8–27)
Bilirubin Total: 0.7 mg/dL (ref 0.0–1.2)
CO2: 21 mmol/L (ref 20–29)
Calcium: 8.8 mg/dL (ref 8.6–10.2)
Chloride: 105 mmol/L (ref 96–106)
Creatinine, Ser: 1 mg/dL (ref 0.76–1.27)
Globulin, Total: 2 g/dL (ref 1.5–4.5)
Glucose: 79 mg/dL (ref 70–99)
Potassium: 4.6 mmol/L (ref 3.5–5.2)
Sodium: 142 mmol/L (ref 134–144)
Total Protein: 6.3 g/dL (ref 6.0–8.5)
eGFR: 82 mL/min/{1.73_m2} (ref >59)

## 2022-07-23 LAB — PSA, TOTAL AND FREE
PSA, Free Pct: 26.2 %
PSA, Free: 0.55 ng/mL
Prostate Specific Ag, Serum: 2.1 ng/mL (ref 0.0–4.0)

## 2022-07-25 NOTE — Addendum Note (Signed)
Addended by: Rolena Infante on: 07/25/2022 01:01 PM   Modules accepted: Orders

## 2022-07-26 ENCOUNTER — Telehealth (INDEPENDENT_AMBULATORY_CARE_PROVIDER_SITE_OTHER): Payer: Medicare HMO | Admitting: Nurse Practitioner

## 2022-07-26 ENCOUNTER — Encounter: Payer: Self-pay | Admitting: Nurse Practitioner

## 2022-07-26 DIAGNOSIS — U071 COVID-19: Secondary | ICD-10-CM | POA: Diagnosis not present

## 2022-07-26 MED ORDER — NIRMATRELVIR/RITONAVIR (PAXLOVID)TABLET: 3.0000 | ORAL_TABLET | Freq: Two times a day (BID) | ORAL | 0 refills | Status: AC

## 2022-07-26 NOTE — Progress Notes (Signed)
Virtual Visit Consent   Kyle Sexton, you are scheduled for a virtual visit with Mary-Margaret Hassell Done, FNP, a [facility] provider, today.     Just as with appointments in the office, your consent must be obtained to participate.  Your consent will be active for this visit and any virtual visit you may have with one of our providers in the next 365 days.     If you have a MyChart account, a copy of this consent can be sent to you electronically.  All virtual visits are billed to your insurance company just like a traditional visit in the office.    As this is a virtual visit, video technology does not allow for your provider to perform a traditional examination.  This may limit your provider's ability to fully assess your condition.  If your provider identifies any concerns that need to be evaluated in person or the need to arrange testing (such as labs, EKG, etc.), we will make arrangements to do so.     Although advances in technology are sophisticated, we cannot ensure that it will always work on either your end or our end.  If the connection with a video visit is poor, the visit may have to be switched to a telephone visit.  With either a video or telephone visit, we are not always able to ensure that we have a secure connection.     I need to obtain your verbal consent now.   Are you willing to proceed with your visit today? YES   Kyle Sexton has provided verbal consent on 07/26/2022 for a virtual visit (video or telephone).   Mary-Margaret Hassell Done, FNP   Date: 07/26/2022 11:11 AM   Virtual Visit via Video Note   I, Mary-Margaret Lucette Kratz, connected with Kyle Sexton ([id number], 05/11/1956) on 07/26/22 at  2:30 PM EST by a video-enabled telemedicine application and verified that I am speaking with the correct person using two identifiers.  Location: Patient: Virtual Visit Location Patient: Home Provider: Virtual Visit Location Provider: Mobile   I discussed the limitations of  evaluation and management by telemedicine and the availability of in person appointments. The patient expressed understanding and agreed to proceed.    History of Present Illness: Kyle Sexton is a 67 y.o. who identifies as a male who was assigned male at birth, and is being seen today for covid positive.  HPI: URI  This is a new problem. The current episode started yesterday. The problem has been waxing and waning. There has been no fever. Associated symptoms include congestion, coughing, headaches, rhinorrhea and a sore throat. He has tried acetaminophen for the symptoms. The treatment provided mild relief.    Review of Systems  HENT:  Positive for congestion, rhinorrhea and sore throat.   Respiratory:  Positive for cough.   Neurological:  Positive for headaches.    Problems:  Patient Active Problem List   Diagnosis Date Noted   UC (ulcerative colitis) ([place]) 02/08/2022   History of colonic polyps 09/29/2020   Hyperlipidemia 10/24/2012   BPH (benign prostatic hyperplasia) 10/24/2012    Allergies:  Allergies  Allergen Reactions   Bee Venom Anaphylaxis   Medications:  Current Outpatient Medications:    celecoxib (CELEBREX) 200 MG capsule, Take 1 capsule (200 mg total) by mouth 2 (two) times daily., Disp: 60 capsule, Rfl: 0   fluticasone (FLONASE) 50 MCG/ACT nasal spray, Place 2 sprays into both nostrils daily., Disp: 16 g, Rfl: 6   mesalamine (  APRISO) 0.375 g 24 hr capsule, Take 4 capsules (1.5 g total) by mouth daily. Take 4 capsules in morning, (1.5 grams), Disp: 120 capsule, Rfl: 11   sildenafil (REVATIO) 20 MG tablet, TAKE ONE (1) TABLET BY MOUTH 3 TIMES DAILY, Disp: 30 tablet, Rfl: 5  Observations/Objective: Patient is well-developed, well-nourished in no acute distress.  Resting comfortably  at home.  Head is normocephalic, atraumatic.  No labored breathing.  Speech is clear and coherent with logical content.  Patient is alert and oriented at baseline.  Deep  cough Raspy voice  Assessment and Plan:  Kyle Sexton in today with chief complaint of No chief complaint on file.   1. Positive self-administered antigen test for COVID-19 1. Take meds as prescribed 2. Use a cool mist humidifier especially during the winter months and when heat has been humid. 3. Use saline nose sprays frequently 4. Saline irrigations of the nose can be very helpful if done frequently.  * 4X daily for 1 week*  * Use of a nettie pot can be helpful with this. Follow directions with this* 5. Drink plenty of fluids 6. Keep thermostat turn down low 7.For any cough or congestion- mucinex or delsym 8. For fever or aces or pains- take tylenol or ibuprofen appropriate for age and weight.  * for fevers greater than 101 orally you may alternate ibuprofen and tylenol every  3 hours.    Meds ordered this encounter  Medications   nirmatrelvir/ritonavir (PAXLOVID) 20 x 150 MG & 10 x '100MG'$  TABS    Sig: Take 3 tablets by mouth 2 (two) times daily for 5 days. (Take nirmatrelvir 150 mg two tablets twice daily for 5 days and ritonavir 100 mg one tablet twice daily for 5 days) Patient GFR is 89    Dispense:  30 tablet    Refill:  0    Order Specific Question:   Supervising Provider    Answer:   Caryl Pina A [id number]      Follow Up Instructions: I discussed the assessment and treatment plan with the patient. The patient was provided an opportunity to ask questions and all were answered. The patient agreed with the plan and demonstrated an understanding of the instructions.  A copy of instructions were sent to the patient via MyChart.  The patient was advised to call back or seek an in-person evaluation if the symptoms worsen or if the condition fails to improve as anticipated.  Time:  I spent 10 minutes with the patient via telehealth technology discussing the above problems/concerns.    Mary-Margaret Hassell Done, FNP

## 2022-08-03 ENCOUNTER — Telehealth: Payer: Self-pay | Admitting: Nurse Practitioner

## 2022-08-03 NOTE — Telephone Encounter (Signed)
Contacted Kyle Sexton to schedule their annual wellness visit. Patient declined to schedule AWV at this time.  States he works and cannot do during time frame offered  Thank you,  Colletta Maryland,  [place] ??[phone]

## 2022-08-07 ENCOUNTER — Other Ambulatory Visit: Payer: Self-pay | Admitting: Nurse Practitioner

## 2022-08-07 DIAGNOSIS — M19049 Primary osteoarthritis, unspecified hand: Secondary | ICD-10-CM

## 2022-08-29 ENCOUNTER — Encounter: Payer: Self-pay | Admitting: Dermatology

## 2022-08-29 ENCOUNTER — Ambulatory Visit: Payer: Medicare HMO | Admitting: Dermatology

## 2022-08-29 DIAGNOSIS — L821 Other seborrheic keratosis: Secondary | ICD-10-CM | POA: Diagnosis not present

## 2022-08-29 DIAGNOSIS — C44519 Basal cell carcinoma of skin of other part of trunk: Secondary | ICD-10-CM

## 2022-08-29 DIAGNOSIS — W908XXA Exposure to other nonionizing radiation, initial encounter: Secondary | ICD-10-CM

## 2022-08-29 DIAGNOSIS — D489 Neoplasm of uncertain behavior, unspecified: Secondary | ICD-10-CM

## 2022-08-29 DIAGNOSIS — L578 Other skin changes due to chronic exposure to nonionizing radiation: Secondary | ICD-10-CM

## 2022-08-29 DIAGNOSIS — C44311 Basal cell carcinoma of skin of nose: Secondary | ICD-10-CM | POA: Diagnosis not present

## 2022-08-29 DIAGNOSIS — Z1283 Encounter for screening for malignant neoplasm of skin: Secondary | ICD-10-CM | POA: Diagnosis not present

## 2022-08-29 DIAGNOSIS — X32XXXA Exposure to sunlight, initial encounter: Secondary | ICD-10-CM | POA: Diagnosis not present

## 2022-08-29 DIAGNOSIS — D2362 Other benign neoplasm of skin of left upper limb, including shoulder: Secondary | ICD-10-CM

## 2022-08-29 DIAGNOSIS — L814 Other melanin hyperpigmentation: Secondary | ICD-10-CM

## 2022-08-29 DIAGNOSIS — D1801 Hemangioma of skin and subcutaneous tissue: Secondary | ICD-10-CM

## 2022-08-29 DIAGNOSIS — C4491 Basal cell carcinoma of skin, unspecified: Secondary | ICD-10-CM

## 2022-08-29 HISTORY — DX: Basal cell carcinoma of skin, unspecified: C44.91

## 2022-08-29 NOTE — Progress Notes (Addendum)
New Patient Visit   Subjective  Kyle Sexton is a 67 y.o. male who presents for the following: Skin Cancer Screening and Full Body Skin Exam  The patient presents for Total-Body Skin Exam (TBSE) for skin cancer screening and mole check. The patient has spots, moles and lesions to be evaluated, some may be new or changing and the patient has concerns that these could be cancer.  Patient is here for a full body skin exam. Nevi on left shoulder, a cyst on mid back, spot check on right cheek, spot on nose, spot on left ankle and thigh. No family hx of skin cancer and no personal hx of skin cancer or abnormal nevi.   The following portions of the chart were reviewed this encounter and updated as appropriate: medications, allergies, medical history  Review of Systems:  No other skin or systemic complaints except as noted in HPI or Assessment and Plan.  Objective  Well appearing patient in no apparent distress; mood and affect are within normal limits.  A full examination was performed including scalp, head, eyes, ears, nose, lips, neck, chest, axillae, abdomen, back, buttocks, bilateral upper extremities, bilateral lower extremities, hands, feet, fingers, toes, fingernails, and toenails. All findings within normal limits unless otherwise noted below.   Relevant physical exam findings are noted in the Assessment and Plan.  Mid Tip of Nose 4mm Irregular Brown/tan nodule     Left Shoulder - Anterior 5mm Irregular brown macule        Mid Back 4mm Irregular brown/black macule         Assessment & Plan   LENTIGINES, SEBORRHEIC KERATOSES, HEMANGIOMAS - Benign normal skin lesions - Benign-appearing - Call for any changes  MELANOCYTIC NEVI - Tan-brown and/or pink-flesh-colored symmetric macules and papules - Benign appearing on exam today - Observation - Call clinic for new or changing moles - Recommend daily use of broad spectrum spf 30+ sunscreen to sun-exposed areas.    ACTINIC DAMAGE - Chronic condition, secondary to cumulative UV/sun exposure - diffuse scaly erythematous macules with underlying dyspigmentation - Recommend daily broad spectrum sunscreen SPF 30+ to sun-exposed areas, reapply every 2 hours as needed.  - Staying in the shade or wearing long sleeves, sun glasses (UVA+UVB protection) and wide brim hats (4-inch brim around the entire circumference of the hat) are also recommended for sun protection.  - Call for new or changing lesions.  SKIN CANCER SCREENING PERFORMED TODAY.    Neoplasm of uncertain behavior (3) Mid Tip of Nose  Skin / nail biopsy Type of biopsy: tangential   Informed consent: discussed and consent obtained   Timeout: patient name, date of birth, surgical site, and procedure verified   Procedure prep:  Patient was prepped and draped in usual sterile fashion Prep type:  Isopropyl alcohol Anesthesia: the lesion was anesthetized in a standard fashion   Anesthetic:  1% lidocaine w/ epinephrine 1-100,000 buffered w/ 8.4% NaHCO3 Instrument used: DermaBlade   Hemostasis achieved with: aluminum chloride   Outcome: patient tolerated procedure well   Post-procedure details: sterile dressing applied and wound care instructions given   Dressing type: petrolatum and bandage    Specimen 1 - Surgical pathology Differential Diagnosis: Rule out dysplastic nevus    Check Margins: No  Left Shoulder - Anterior  Skin / nail biopsy Type of biopsy: tangential   Informed consent: discussed and consent obtained   Timeout: patient name, date of birth, surgical site, and procedure verified   Procedure prep:  Patient was  prepped and draped in usual sterile fashion Prep type:  Isopropyl alcohol Anesthesia: the lesion was anesthetized in a standard fashion   Anesthetic:  1% lidocaine w/ epinephrine 1-100,000 buffered w/ 8.4% NaHCO3 Instrument used: DermaBlade   Hemostasis achieved with: aluminum chloride   Outcome: patient tolerated  procedure well   Post-procedure details: sterile dressing applied and wound care instructions given   Dressing type: petrolatum and bandage    Specimen 2 - Surgical pathology Differential Diagnosis: Rule out BCC  Check Margins: No  Mid Back  Skin / nail biopsy Type of biopsy: tangential   Informed consent: discussed and consent obtained   Timeout: patient name, date of birth, surgical site, and procedure verified   Procedure prep:  Patient was prepped and draped in usual sterile fashion Prep type:  Isopropyl alcohol Anesthesia: the lesion was anesthetized in a standard fashion   Anesthetic:  1% lidocaine w/ epinephrine 1-100,000 buffered w/ 8.4% NaHCO3 Instrument used: DermaBlade   Hemostasis achieved with: pressure, aluminum chloride and electrodesiccation   Outcome: patient tolerated procedure well   Post-procedure details: sterile dressing applied and wound care instructions given   Dressing type: petrolatum and bandage    Specimen 3 - Surgical pathology Differential Diagnosis: Rule out pigmented BCC vs blue nevus vs dysplastic nevus  Check Margins: No  Actinic skin damage  Lentigines  Seborrheic keratosis  Skin exam for malignant neoplasm  Hemangioma of skin    Return in about 1 year (around 08/29/2023) for Annual skin exam.    Documentation: I have reviewed the above documentation for accuracy and completeness, and I agree with the above.  Langston ReusingJennifer Waunetta Riggle, MD

## 2022-08-29 NOTE — Patient Instructions (Signed)
Patient Handout: Wound Care for Skin Biopsy Site  Patient Handout: Wound Care for Skin Biopsy Site  Taking Care of Your Skin Biopsy Site  Proper care of the biopsy site is essential for promoting healing and minimizing scarring. This handout provides instructions on how to care for your biopsy site to ensure optimal recovery.  1. Cleaning the Wound:  Clean the biopsy site daily with gentle soap and water. Gently pat the area dry with a clean, soft towel. Avoid harsh scrubbing or rubbing the area, as this can irritate the skin and delay healing.  2. Applying Aquaphor and Bandage:  After cleaning the wound, apply a thin layer of Aquaphor ointment to the biopsy site. Cover the area with a sterile bandage to protect it from dirt, bacteria, and friction. Change the bandage daily or as needed if it becomes soiled or wet.  3. Continued Care for One Week:  Repeat the cleaning, Aquaphor application, and bandaging process daily for one week following the biopsy procedure. Keeping the wound clean and moist during this initial healing period will help prevent infection and promote optimal healing.  4. Massaging Aquaphor into the Area:  ---After one week, discontinue the use of bandages but continue to apply Aquaphor to the biopsy site. ----Gently massage the Aquaphor into the area using circular motions. ---Massaging the skin helps to promote circulation and prevent the formation of scar tissue.   Additional Tips:  Avoid exposing the biopsy site to direct sunlight during the healing process, as this can cause hyperpigmentation or worsen scarring. If you experience any signs of infection, such as increased redness, swelling, warmth, or drainage from the wound, contact your healthcare provider immediately. Follow any additional instructions provided by your healthcare provider for caring for the biopsy site and managing any discomfort. Conclusion:  Taking proper care of your skin biopsy site  is crucial for ensuring optimal healing and minimizing scarring. By following these instructions for cleaning, applying Aquaphor, and massaging the area, you can promote a smooth and successful recovery. If you have any questions or concerns about caring for your biopsy site, don't hesitate to contact your healthcare provider for guidance.      Due to recent changes in healthcare laws, you may see results of your pathology and/or laboratory studies on MyChart before the doctors have had a chance to review them. We understand that in some cases there may be results that are confusing or concerning to you. Please understand that not all results are received at the same time and often the doctors may need to interpret multiple results in order to provide you with the best plan of care or course of treatment. Therefore, we ask that you please give us 2 business days to thoroughly review all your results before contacting the office for clarification. Should we see a critical lab result, you will be contacted sooner.   If You Need Anything After Your Visit  If you have any questions or concerns for your doctor, please call our main line at [phone] If no one answers, please leave a voicemail as directed and we will return your call as soon as possible. Messages left after 4 pm will be answered the following business day.   You may also send us a message via MyChart. We typically respond to MyChart messages within 1-2 business days.  For prescription refills, please ask your pharmacy to contact our office. Our fax number is [phone].  If you have an urgent issue when the clinic is   closed that cannot wait until the next business day, you can page your doctor at the number below.    Please note that while we do our best to be available for urgent issues outside of office hours, we are not available 24/7.   If you have an urgent issue and are unable to reach us, you may choose to seek medical care  at your doctor's office, retail clinic, urgent care center, or emergency room.  If you have a medical emergency, please immediately call 911 or go to the emergency department. In the event of inclement weather, please call our main line at [phone] for an update on the status of any delays or closures.  Dermatology Medication Tips: Please keep the boxes that topical medications come in in order to help keep track of the instructions about where and how to use these. Pharmacies typically print the medication instructions only on the boxes and not directly on the medication tubes.   If your medication is too expensive, please contact our office at [phone] or send us a message through MyChart.   We are unable to tell what your co-pay for medications will be in advance as this is different depending on your insurance coverage. However, we may be able to find a substitute medication at lower cost or fill out paperwork to get insurance to cover a needed medication.   If a prior authorization is required to get your medication covered by your insurance company, please allow us 1-2 business days to complete this process.  Drug prices often vary depending on where the prescription is filled and some pharmacies may offer cheaper prices.  The website www.goodrx.com contains coupons for medications through different pharmacies. The prices here do not account for what the cost may be with help from insurance (it may be cheaper with your insurance), but the website can give you the price if you did not use any insurance.  - You can print the associated coupon and take it with your prescription to the pharmacy.  - You may also stop by our office during regular business hours and pick up a GoodRx coupon card.  - If you need your prescription sent electronically to a different pharmacy, notify our office through [facility] MyChart or by phone at [phone]     

## 2022-09-02 ENCOUNTER — Other Ambulatory Visit: Payer: Self-pay | Admitting: Nurse Practitioner

## 2022-09-08 ENCOUNTER — Telehealth: Payer: Self-pay

## 2022-09-08 ENCOUNTER — Other Ambulatory Visit: Payer: Self-pay | Admitting: Nurse Practitioner

## 2022-09-08 ENCOUNTER — Other Ambulatory Visit: Payer: Self-pay

## 2022-09-08 DIAGNOSIS — C44319 Basal cell carcinoma of skin of other parts of face: Secondary | ICD-10-CM

## 2022-09-08 DIAGNOSIS — M19049 Primary osteoarthritis, unspecified hand: Secondary | ICD-10-CM

## 2022-09-08 NOTE — Telephone Encounter (Signed)
-----   Message from Terri Piedra, MD sent at 09/08/2022 11:00 AM EDT ----- Please call patient and notify that results of 2 biopsies were positive for a skin cancer that needs to be treated with Mohs Surgery and a surgical excision.  Diagnosis 1. Skin , mid tip of nose BASAL CELL CARCINOMA, NODULAR PATTERN --> Mohs 2. Skin , left shoulder-anterior BLUE NEVUS, BASE INVOLVED --> Benign 3. Skin , mid back BASAL CELL CARCINOMA, NODULAR PATTERN, PIGMENTED --> Surgical Excision     We will refer to Dr. Elvin So at [facility]  [facility] [address] [place], [address] [place], [place] [address]  Phone: [phone] Fax: [phone]

## 2022-09-08 NOTE — Progress Notes (Addendum)
Please call patient and notify that results of 2 biopsies were positive for a skin cancer that needs to be treated with Mohs Surgery and a surgical excision.  Diagnosis 1. Skin , mid tip of nose BASAL CELL CARCINOMA, NODULAR PATTERN --> Mohs 2. Skin , left shoulder-anterior BLUE NEVUS, BASE INVOLVED --> Benign 3. Skin , mid back BASAL CELL CARCINOMA, NODULAR PATTERN, PIGMENTED --> Surgical Excision     We will refer to Dr. Elvin So at [facility]  [facility] [address], [address] [place], [place] [address]  Phone: [phone] Fax: [phone]

## 2022-09-12 NOTE — Telephone Encounter (Signed)
Patient advised of results/hd  

## 2022-09-30 DIAGNOSIS — Z481 Encounter for planned postprocedural wound closure: Secondary | ICD-10-CM | POA: Diagnosis not present

## 2022-09-30 DIAGNOSIS — C44311 Basal cell carcinoma of skin of nose: Secondary | ICD-10-CM | POA: Diagnosis not present

## 2022-09-30 DIAGNOSIS — C4490 Unspecified malignant neoplasm of skin, unspecified: Secondary | ICD-10-CM | POA: Diagnosis not present

## 2022-09-30 DIAGNOSIS — D049 Carcinoma in situ of skin, unspecified: Secondary | ICD-10-CM | POA: Diagnosis not present

## 2022-09-30 DIAGNOSIS — C4492 Squamous cell carcinoma of skin, unspecified: Secondary | ICD-10-CM | POA: Diagnosis not present

## 2022-09-30 DIAGNOSIS — C4491 Basal cell carcinoma of skin, unspecified: Secondary | ICD-10-CM | POA: Diagnosis not present

## 2022-09-30 DIAGNOSIS — C439 Malignant melanoma of skin, unspecified: Secondary | ICD-10-CM | POA: Diagnosis not present

## 2022-10-08 ENCOUNTER — Other Ambulatory Visit: Payer: Self-pay | Admitting: Nurse Practitioner

## 2022-10-27 ENCOUNTER — Other Ambulatory Visit: Payer: Self-pay | Admitting: Nurse Practitioner

## 2022-10-27 DIAGNOSIS — M19049 Primary osteoarthritis, unspecified hand: Secondary | ICD-10-CM

## 2022-11-10 ENCOUNTER — Other Ambulatory Visit: Payer: Self-pay | Admitting: Nurse Practitioner

## 2022-11-11 DIAGNOSIS — C44519 Basal cell carcinoma of skin of other part of trunk: Secondary | ICD-10-CM | POA: Diagnosis not present

## 2023-01-03 ENCOUNTER — Other Ambulatory Visit: Payer: Self-pay | Admitting: Nurse Practitioner

## 2023-01-20 DIAGNOSIS — M205X2 Other deformities of toe(s) (acquired), left foot: Secondary | ICD-10-CM | POA: Diagnosis not present

## 2023-01-20 DIAGNOSIS — B351 Tinea unguium: Secondary | ICD-10-CM | POA: Diagnosis not present

## 2023-01-20 DIAGNOSIS — M205X1 Other deformities of toe(s) (acquired), right foot: Secondary | ICD-10-CM | POA: Diagnosis not present

## 2023-01-20 DIAGNOSIS — M792 Neuralgia and neuritis, unspecified: Secondary | ICD-10-CM | POA: Diagnosis not present

## 2023-01-20 DIAGNOSIS — M7671 Peroneal tendinitis, right leg: Secondary | ICD-10-CM | POA: Diagnosis not present

## 2023-01-30 DIAGNOSIS — B351 Tinea unguium: Secondary | ICD-10-CM | POA: Diagnosis not present

## 2023-02-12 ENCOUNTER — Other Ambulatory Visit: Payer: Self-pay | Admitting: Nurse Practitioner

## 2023-02-12 DIAGNOSIS — M19049 Primary osteoarthritis, unspecified hand: Secondary | ICD-10-CM

## 2023-02-26 ENCOUNTER — Other Ambulatory Visit: Payer: Self-pay | Admitting: Nurse Practitioner

## 2023-03-11 DIAGNOSIS — Z008 Encounter for other general examination: Secondary | ICD-10-CM | POA: Diagnosis not present

## 2023-03-14 ENCOUNTER — Other Ambulatory Visit: Payer: Self-pay | Admitting: Nurse Practitioner

## 2023-03-14 DIAGNOSIS — M19049 Primary osteoarthritis, unspecified hand: Secondary | ICD-10-CM

## 2023-04-13 ENCOUNTER — Other Ambulatory Visit: Payer: Self-pay | Admitting: Nurse Practitioner

## 2023-04-13 DIAGNOSIS — M19049 Primary osteoarthritis, unspecified hand: Secondary | ICD-10-CM

## 2023-04-24 ENCOUNTER — Other Ambulatory Visit: Payer: Self-pay | Admitting: Nurse Practitioner

## 2023-05-13 ENCOUNTER — Other Ambulatory Visit: Payer: Self-pay | Admitting: Gastroenterology

## 2023-05-15 ENCOUNTER — Other Ambulatory Visit: Payer: Self-pay | Admitting: Gastroenterology

## 2023-05-16 ENCOUNTER — Other Ambulatory Visit: Payer: Self-pay | Admitting: Nurse Practitioner

## 2023-05-16 DIAGNOSIS — M19049 Primary osteoarthritis, unspecified hand: Secondary | ICD-10-CM

## 2023-06-10 ENCOUNTER — Other Ambulatory Visit: Payer: Self-pay | Admitting: Family

## 2023-06-10 DIAGNOSIS — M19049 Primary osteoarthritis, unspecified hand: Secondary | ICD-10-CM

## 2023-07-15 ENCOUNTER — Other Ambulatory Visit: Payer: Self-pay | Admitting: Nurse Practitioner

## 2023-07-15 DIAGNOSIS — M19049 Primary osteoarthritis, unspecified hand: Secondary | ICD-10-CM

## 2023-08-06 ENCOUNTER — Other Ambulatory Visit: Payer: Self-pay | Admitting: Nurse Practitioner

## 2023-08-07 NOTE — Telephone Encounter (Signed)
 Appt scheduled 08/14/23

## 2023-08-07 NOTE — Telephone Encounter (Signed)
 Refill denied, pt hasn't been seen in office in over 1 year. Please schedule with PCP for any refills.

## 2023-08-14 ENCOUNTER — Encounter: Payer: Self-pay | Admitting: Nurse Practitioner

## 2023-08-14 ENCOUNTER — Ambulatory Visit (INDEPENDENT_AMBULATORY_CARE_PROVIDER_SITE_OTHER): Admitting: Nurse Practitioner

## 2023-08-14 VITALS — BP 122/82 | HR 69 | Temp 97.5°F | Ht 76.0 in | Wt 208.0 lb

## 2023-08-14 DIAGNOSIS — Z0001 Encounter for general adult medical examination with abnormal findings: Secondary | ICD-10-CM

## 2023-08-14 DIAGNOSIS — E782 Mixed hyperlipidemia: Secondary | ICD-10-CM | POA: Diagnosis not present

## 2023-08-14 DIAGNOSIS — K5180 Other ulcerative colitis without complications: Secondary | ICD-10-CM

## 2023-08-14 DIAGNOSIS — N400 Enlarged prostate without lower urinary tract symptoms: Secondary | ICD-10-CM | POA: Diagnosis not present

## 2023-08-14 DIAGNOSIS — Z23 Encounter for immunization: Secondary | ICD-10-CM

## 2023-08-14 DIAGNOSIS — Z0000 Encounter for general adult medical examination without abnormal findings: Secondary | ICD-10-CM

## 2023-08-14 MED ORDER — SILDENAFIL CITRATE 20 MG PO TABS: 20.0000 mg | ORAL_TABLET | Freq: Three times a day (TID) | ORAL | 1 refills | Status: DC

## 2023-08-14 NOTE — Addendum Note (Signed)
 Addended by: Cleda Daub on: 08/14/2023 03:56 PM   Modules accepted: Orders

## 2023-08-14 NOTE — Progress Notes (Signed)
 Subjective:    Patient ID: Kyle Sexton, male    DOB: [birth year], 68 y.o.   MRN: [id number]   Chief Complaint: annual physical    HPI:  Kyle Sexton is a 68 y.o. who identifies as a male who was assigned male at birth.   Social history: Lives with: by hisself Work history: Chief Operating Officer   Comes in today for follow up of the following chronic medical issues:  1. Annual physical exam  2. Mixed hyperlipidemia Does watch diet and stays very active Lab Results  Component Value Date   CHOL 158 07/22/2022   HDL 64 07/22/2022   LDLCALC 85 07/22/2022   TRIG 43 07/22/2022   CHOLHDL 2.5 07/22/2022   The 10-year ASCVD risk score (Arnett DK, et al., 2019) is: 11.3%   3. Other ulcerative colitis without complication (HCC) No recent flare ups  4. Benign prostatic hyperplasia without lower urinary tract symptoms No voiding issues   New complaints: None today  Allergies  Allergen Reactions   Bee Venom Anaphylaxis   Outpatient Encounter Medications as of 08/14/2023  Medication Sig   celecoxib (CELEBREX) 200 MG capsule Take 1 capsule (200 mg total) by mouth 2 (two) times daily. **NEEDS TO BE SEEN BEFORE NEXT REFILL**   fluticasone (FLONASE) 50 MCG/ACT nasal spray Place 2 sprays into both nostrils daily.   mesalamine (APRISO) 0.375 g 24 hr capsule TAKE 4 CAPSULES (1.5 G TOTAL) BY MOUTH DAILY IN THE MORNING   sildenafil (REVATIO) 20 MG tablet TAKE ONE (1) TABLET BY MOUTH 3 TIMES DAILY   No facility-administered encounter medications on file as of 08/14/2023.    Past Surgical History:  Procedure Laterality Date   ARGON LASER APPLICATION  11/26/2020   Procedure: ARGON LASER APPLICATION;  Surgeon: Corbin Ade, MD;  [facility];  Service: Endoscopy;;   BIOPSY  11/26/2020   Procedure: BIOPSY;  Surgeon: Corbin Ade, MD;  [facility];  Service: Endoscopy;;   BIOPSY  03/28/2022   Procedure: BIOPSY;  Surgeon: Corbin Ade, MD;  [facility];  Service: Endoscopy;;   CATARACT EXTRACTION Bilateral    COLONOSCOPY  05/23/2005   Dr. Jena Gauss: diminutive rectal polyp and 2X2 cm complex, carpet-like polyp, removed via piecemeal fashion, with remaining tissue ablated with APC. fragments of tubular adenoma   COLONOSCOPY N/A 11/06/2012   NGE:XBMW sided proctocolitis. Colonic diverticulosis   COLONOSCOPY WITH PROPOFOL N/A 11/26/2020   diffusely abnormal mild changes of IBD/UC s/p segmental biopsy. Multiple clusters of hyperplastic appearing polyps throughout colon most likely representing pseudopolyps s/p biopsy. One 4X5 carpet polyp in cecum at IC valve. Second 1 cm polyp removed via piecemeal fashion. Path with quiescent colitis. Tubular adenomas. Overdue for 6 month follow-up.   COLONOSCOPY WITH PROPOFOL N/A 03/28/2022   Procedure: COLONOSCOPY WITH PROPOFOL;  Surgeon: Corbin Ade, MD;  [facility];  Service: Endoscopy;  Laterality: N/A;  10:45 AM   KNEE SURGERY     both   POLYPECTOMY  11/26/2020   Procedure: POLYPECTOMY;  Surgeon: Corbin Ade, MD;  [facility];  Service: Endoscopy;;   POLYPECTOMY  03/28/2022   Procedure: POLYPECTOMY;  Surgeon: Corbin Ade, MD;  [facility];  Service: Endoscopy;;    Family History  Problem Relation Age of Onset   Diabetes Mother    Crohn's disease Son    Colon cancer Neg Hx       Controlled substance contract: n/a  Review of Systems  Constitutional:  Negative for diaphoresis.  Eyes:  Negative for pain.  Respiratory:  Negative for shortness of breath.   Cardiovascular:  Negative for chest pain, palpitations and leg swelling.  Gastrointestinal:  Negative for abdominal pain.  Endocrine: Negative for polydipsia.  Skin:  Negative for rash.  Neurological:  Negative for dizziness, weakness and headaches.  Hematological:  Does not bruise/bleed easily.  All other systems reviewed and are negative.      Objective:   Physical  Exam Vitals and nursing note reviewed.  Constitutional:      Appearance: Normal appearance. He is well-developed.  HENT:     Head: Normocephalic.     Nose: Nose normal.     Mouth/Throat:     Mouth: Mucous membranes are moist.     Pharynx: Oropharynx is clear.  Eyes:     Pupils: Pupils are equal, round, and reactive to light.  Neck:     Thyroid: No thyroid mass or thyromegaly.     Vascular: No carotid bruit or JVD.     Trachea: Phonation normal.  Cardiovascular:     Rate and Rhythm: Normal rate and regular rhythm.  Pulmonary:     Effort: Pulmonary effort is normal. No respiratory distress.     Breath sounds: Normal breath sounds.  Abdominal:     General: Bowel sounds are normal.     Palpations: Abdomen is soft.     Tenderness: There is no abdominal tenderness.     Hernia: A hernia (soft nontender ventral hernia) is present.  Musculoskeletal:        General: Normal range of motion.     Cervical back: Normal range of motion and neck supple.  Lymphadenopathy:     Cervical: No cervical adenopathy.  Skin:    General: Skin is warm and dry.  Neurological:     Mental Status: He is alert and oriented to person, place, and time.  Psychiatric:        Behavior: Behavior normal.        Thought Content: Thought content normal.        Judgment: Judgment normal.    BP 122/82   Pulse 69   Temp (!) 97.5 F (36.4 C) (Temporal)   Ht 6\' 4"  (1.93 m)   Wt 208 lb (94.3 kg)   SpO2 95%   BMI 25.32 kg/m           Assessment & Plan:  [name] in today with chief complaint of medical management of chronic issues    1. Annual physical exam - CBC with Differential/Platelet  2. Mixed hyperlipidemia Low fat diet - CMP14+EGFR - Lipid panel  3. Other ulcerative colitis without complication (HCC)  4. Benign prostatic hyperplasia without lower urinary tract symptoms Report any voiding issues - PSA, total and free    The above assessment and management plan was discussed  with the patient. The patient verbalized understanding of and has agreed to the management plan. Patient is aware to call the clinic if symptoms persist or worsen. Patient is aware when to return to the clinic for a follow-up visit. Patient educated on when it is appropriate to go to the emergency department.   Mary-Margaret Daphine Deutscher, FNP

## 2023-08-14 NOTE — Patient Instructions (Signed)
 Exercising to Stay Healthy To become healthy and stay healthy, it is recommended that you do moderate-intensity and vigorous-intensity exercise. You can tell that you are exercising at a moderate intensity if your heart starts beating faster and you start breathing faster but can still hold a conversation. You can tell that you are exercising at a vigorous intensity if you are breathing much harder and faster and cannot hold a conversation while exercising. How can exercise benefit me? Exercising regularly is important. It has many health benefits, such as: Improving overall fitness, flexibility, and endurance. Increasing bone density. Helping with weight control. Decreasing body fat. Increasing muscle strength and endurance. Reducing stress and tension, anxiety, depression, or anger. Improving overall health. What guidelines should I follow while exercising? Before you start a new exercise program, talk with your health care provider. Do not exercise so much that you hurt yourself, feel dizzy, or get very short of breath. Wear comfortable clothes and wear shoes with good support. Drink plenty of water while you exercise to prevent dehydration or heat stroke. Work out until your breathing and your heartbeat get faster (moderate intensity). How often should I exercise? Choose an activity that you enjoy, and set realistic goals. Your health care provider can help you make an activity plan that is individually designed and works best for you. Exercise regularly as told by your health care provider. This may include: Doing strength training two times a week, such as: Lifting weights. Using resistance bands. Push-ups. Sit-ups. Yoga. Doing a certain intensity of exercise for a given amount of time. Choose from these options: A total of 150 minutes of moderate-intensity exercise every week. A total of 75 minutes of vigorous-intensity exercise every week. A mix of moderate-intensity and  vigorous-intensity exercise every week. Children, pregnant women, people who have not exercised regularly, people who are overweight, and older adults may need to talk with a health care provider about what activities are safe to perform. If you have a medical condition, be sure to talk with your health care provider before you start a new exercise program. What are some exercise ideas? Moderate-intensity exercise ideas include: Walking 1 mile (1.6 km) in about 15 minutes. Biking. Hiking. Golfing. Dancing. Water aerobics. Vigorous-intensity exercise ideas include: Walking 4.5 miles (7.2 km) or more in about 1 hour. Jogging or running 5 miles (8 km) in about 1 hour. Biking 10 miles (16.1 km) or more in about 1 hour. Lap swimming. Roller-skating or in-line skating. Cross-country skiing. Vigorous competitive sports, such as football, basketball, and soccer. Jumping rope. Aerobic dancing. What are some everyday activities that can help me get exercise? Yard work, such as: Child psychotherapist. Raking and bagging leaves. Washing your car. Pushing a stroller. Shoveling snow. Gardening. Washing windows or floors. How can I be more active in my day-to-day activities? Use stairs instead of an elevator. Take a walk during your lunch break. If you drive, park your car farther away from your work or school. If you take public transportation, get off one stop early and walk the rest of the way. Stand up or walk around during all of your indoor phone calls. Get up, stretch, and walk around every 30 minutes throughout the day. Enjoy exercise with a friend. Support to continue exercising will help you keep a regular routine of activity. Where to find more information You can find more information about exercising to stay healthy from: U.S. Department of Health and Human Services: ThisPath.fi Centers for Disease Control and Prevention (  CDC): FootballExhibition.com.br Summary Exercising regularly is  important. It will improve your overall fitness, flexibility, and endurance. Regular exercise will also improve your overall health. It can help you control your weight, reduce stress, and improve your bone density. Do not exercise so much that you hurt yourself, feel dizzy, or get very short of breath. Before you start a new exercise program, talk with your health care provider. This information is not intended to replace advice given to you by your health care provider. Make sure you discuss any questions you have with your health care provider. Document Revised: 09/04/2020 Document Reviewed: 09/04/2020 Elsevier Patient Education  2024 [employer].

## 2023-08-15 LAB — CBC WITH DIFFERENTIAL/PLATELET
Basophils Absolute: 0.1 10*3/uL (ref 0.0–0.2)
Basos: 1 %
EOS (ABSOLUTE): 0.1 10*3/uL (ref 0.0–0.4)
Eos: 2 %
Hematocrit: 49.5 % (ref 37.5–51.0)
Hemoglobin: 16.3 g/dL (ref 13.0–17.7)
Immature Grans (Abs): 0 10*3/uL (ref 0.0–0.1)
Immature Granulocytes: 0 %
Lymphocytes Absolute: 2.1 10*3/uL (ref 0.7–3.1)
Lymphs: 29 %
MCH: 30.2 pg (ref 26.6–33.0)
MCHC: 32.9 g/dL (ref 31.5–35.7)
MCV: 92 fL (ref 79–97)
Monocytes Absolute: 0.8 10*3/uL (ref 0.1–0.9)
Monocytes: 12 %
Neutrophils Absolute: 4 10*3/uL (ref 1.4–7.0)
Neutrophils: 56 %
Platelets: 234 10*3/uL (ref 150–450)
RBC: 5.4 x10E6/uL (ref 4.14–5.80)
RDW: 12.7 % (ref 11.6–15.4)
WBC: 7.1 10*3/uL (ref 3.4–10.8)

## 2023-08-15 LAB — CMP14+EGFR
ALT: 21 IU/L (ref 0–44)
AST: 23 IU/L (ref 0–40)
Albumin: 4.6 g/dL (ref 3.9–4.9)
Alkaline Phosphatase: 73 IU/L (ref 44–121)
BUN/Creatinine Ratio: 18 (ref 10–24)
BUN: 17 mg/dL (ref 8–27)
Bilirubin Total: 0.5 mg/dL (ref 0.0–1.2)
CO2: 23 mmol/L (ref 20–29)
Calcium: 9.5 mg/dL (ref 8.6–10.2)
Chloride: 103 mmol/L (ref 96–106)
Creatinine, Ser: 0.92 mg/dL (ref 0.76–1.27)
Globulin, Total: 2.1 g/dL (ref 1.5–4.5)
Glucose: 87 mg/dL (ref 70–99)
Potassium: 4.3 mmol/L (ref 3.5–5.2)
Sodium: 140 mmol/L (ref 134–144)
Total Protein: 6.7 g/dL (ref 6.0–8.5)
eGFR: 91 mL/min/{1.73_m2} (ref >59)

## 2023-08-15 LAB — LIPID PANEL
Chol/HDL Ratio: 2.9 ratio (ref 0.0–5.0)
Cholesterol, Total: 169 mg/dL (ref 100–199)
HDL: 59 mg/dL (ref >39)
LDL Chol Calc (NIH): 93 mg/dL (ref 0–99)
Triglycerides: 93 mg/dL (ref 0–149)
VLDL Cholesterol Cal: 17 mg/dL (ref 5–40)

## 2023-08-19 ENCOUNTER — Other Ambulatory Visit: Payer: Self-pay | Admitting: Nurse Practitioner

## 2023-08-19 DIAGNOSIS — M19049 Primary osteoarthritis, unspecified hand: Secondary | ICD-10-CM

## 2023-09-12 ENCOUNTER — Encounter: Payer: Self-pay | Admitting: *Deleted

## 2023-10-06 ENCOUNTER — Other Ambulatory Visit: Payer: Self-pay | Admitting: Nurse Practitioner

## 2023-10-06 MED ORDER — EPINEPHRINE 0.3 MG/0.3ML IJ SOAJ: 0.3000 mg | INTRAMUSCULAR | 3 refills | Status: AC | PRN

## 2023-10-13 ENCOUNTER — Telehealth: Payer: Self-pay | Admitting: *Deleted

## 2023-10-13 NOTE — Telephone Encounter (Signed)
  Procedure: COLONOSCOPY  Height: 6'4 Weight: 208LBS        Have you had a colonoscopy before?  03/28/22, Dr. Riley Cheadle  Do you have family history of colon cancer?  no  Do you have a family history of polyps? Not sure  Previous colonoscopy with polyps removed? yes  Do you have a history colorectal cancer?   no  Are you diabetic?  no  Do you have a prosthetic or mechanical heart valve? no  Do you have a pacemaker/defibrillator?   no  Have you had endocarditis/atrial fibrillation?  no  Do you use supplemental oxygen/CPAP?  no  Have you had joint replacement within the last 12 months?  no  Do you tend to be constipated or have to use laxatives?  no   Do you have history of alcohol use? If yes, how much and how often.  4 beers per day  Do you have history or are you using drugs? If yes, what do are you  using?  no  Have you ever had a stroke/heart attack?  no  Have you ever had a heart or other vascular stent placed,?no  Do you take weight loss medication? no  Do you take any blood-thinning medications such as: (Plavix, aspirin, Coumadin, Aggrenox, Brilinta, Xarelto, Eliquis, Pradaxa, Savaysa or Effient)? no  If yes we need the name, milligram, dosage and who is prescribing doctor:               Current Outpatient Medications  Medication Sig Dispense Refill   celecoxib  (CELEBREX ) 200 MG capsule Take 1 capsule (200 mg total) by mouth 2 (two) times daily. 60 capsule 2   EPINEPHrine  0.3 mg/0.3 mL IJ SOAJ injection Inject 0.3 mg into the muscle as needed for anaphylaxis. 2 each 3   mesalamine  (APRISO ) 0.375 g 24 hr capsule TAKE 4 CAPSULES (1.5 G TOTAL) BY MOUTH DAILY IN THE MORNING 360 capsule 4   sildenafil  (REVATIO ) 20 MG tablet Take 1 tablet (20 mg total) by mouth 3 (three) times daily. 50 tablet 1   No current facility-administered medications for this visit.    Allergies  Allergen Reactions   Bee Venom Anaphylaxis

## 2023-10-13 NOTE — Telephone Encounter (Signed)
ASA 2.  ?

## 2023-10-18 NOTE — Telephone Encounter (Signed)
 Called to schedule pt and he wants to wait until July, wants Monday or Friday.

## 2023-10-21 ENCOUNTER — Other Ambulatory Visit: Payer: Self-pay | Admitting: Nurse Practitioner

## 2023-10-25 MED ORDER — PEG 3350-KCL-NA BICARB-NACL 420 G PO SOLR: 4000.0000 mL | Freq: Once | ORAL | 0 refills | Status: AC

## 2023-10-25 NOTE — Addendum Note (Signed)
 Addended by: Feliz Hosteller on: 10/25/2023 09:56 AM   Modules accepted: Orders

## 2023-10-25 NOTE — Telephone Encounter (Signed)
 Spoke with pt. He has been scheduled for 7/14. Aware will send instructions to him and rx for prep to pharmacy

## 2023-11-26 ENCOUNTER — Other Ambulatory Visit: Payer: Self-pay | Admitting: Nurse Practitioner

## 2023-11-26 DIAGNOSIS — M19049 Primary osteoarthritis, unspecified hand: Secondary | ICD-10-CM

## 2023-12-04 ENCOUNTER — Ambulatory Visit (HOSPITAL_COMMUNITY): Payer: Self-pay | Admitting: Anesthesiology

## 2023-12-04 ENCOUNTER — Encounter (HOSPITAL_COMMUNITY): Payer: Self-pay | Admitting: Internal Medicine

## 2023-12-04 ENCOUNTER — Encounter (HOSPITAL_COMMUNITY)
Admission: RE | Disposition: A | Discharge status: Home / Self Care | Payer: Self-pay | Source: Home / Self Care | Attending: Internal Medicine

## 2023-12-04 ENCOUNTER — Other Ambulatory Visit: Payer: Self-pay

## 2023-12-04 ENCOUNTER — Ambulatory Visit (HOSPITAL_COMMUNITY)
Admission: RE | Admit: 2023-12-04 | Discharge: 2023-12-04 | Disposition: A | Discharge status: Home / Self Care | Attending: Internal Medicine | Admitting: Internal Medicine

## 2023-12-04 DIAGNOSIS — Z87891 Personal history of nicotine dependence: Secondary | ICD-10-CM | POA: Diagnosis not present

## 2023-12-04 DIAGNOSIS — Z860101 Personal history of adenomatous and serrated colon polyps: Secondary | ICD-10-CM | POA: Insufficient documentation

## 2023-12-04 DIAGNOSIS — Z1211 Encounter for screening for malignant neoplasm of colon: Secondary | ICD-10-CM | POA: Diagnosis not present

## 2023-12-04 DIAGNOSIS — K5730 Diverticulosis of large intestine without perforation or abscess without bleeding: Secondary | ICD-10-CM | POA: Insufficient documentation

## 2023-12-04 DIAGNOSIS — K641 Second degree hemorrhoids: Secondary | ICD-10-CM | POA: Insufficient documentation

## 2023-12-04 DIAGNOSIS — K5150 Left sided colitis without complications: Secondary | ICD-10-CM | POA: Insufficient documentation

## 2023-12-04 DIAGNOSIS — Z860100 Personal history of colon polyps, unspecified: Secondary | ICD-10-CM | POA: Diagnosis not present

## 2023-12-04 HISTORY — PX: COLONOSCOPY: SHX5424

## 2023-12-04 SURGERY — COLONOSCOPY
Anesthesia: General

## 2023-12-04 MED ORDER — LACTATED RINGERS IV SOLN: INTRAVENOUS | Status: DC

## 2023-12-04 MED ORDER — SIMETHICONE 40 MG/0.6ML PO SUSP
ORAL | Status: DC | PRN
  Administered 2023-12-04: 120 mL

## 2023-12-04 MED ORDER — PROPOFOL 500 MG/50ML IV EMUL
INTRAVENOUS | Status: DC | PRN
  Administered 2023-12-04: 70 mg via INTRAVENOUS
  Administered 2023-12-04: 150 ug/kg/min via INTRAVENOUS

## 2023-12-04 MED ORDER — EPHEDRINE SULFATE-NACL 50-0.9 MG/10ML-% IV SOSY
PREFILLED_SYRINGE | INTRAVENOUS | Status: DC | PRN
  Administered 2023-12-04: 10 mg via INTRAVENOUS

## 2023-12-04 NOTE — Anesthesia Postprocedure Evaluation (Signed)
 Anesthesia Post Note  Patient: Kyle Sexton  Procedure(s) Performed: COLONOSCOPY  Patient location during evaluation: Endoscopy Anesthesia Type: General Level of consciousness: awake and alert Pain management: pain level controlled Vital Signs Assessment: post-procedure vital signs reviewed and stable Respiratory status: spontaneous breathing, nonlabored ventilation and respiratory function stable Cardiovascular status: blood pressure returned to baseline and stable Postop Assessment: no apparent nausea or vomiting Anesthetic complications: no   There were no known notable events for this encounter.   Last Vitals:  Vitals:   12/04/23 0837 12/04/23 0840  BP: (!) 89/56 97/63  Pulse: 67   Resp: 17   Temp: 36.6 C   SpO2: 93% 96%    Last Pain:  Vitals:   12/04/23 0837  TempSrc: Axillary  PainSc: 0-No pain                 Ytzel Gubler L Terrica Duecker

## 2023-12-04 NOTE — Discharge Instructions (Signed)
  Colonoscopy Discharge Instructions  Read the instructions outlined below and refer to this sheet in the next few weeks. These discharge instructions provide you with general information on caring for yourself after you leave the hospital. Your doctor may also give you specific instructions. While your treatment has been planned according to the most current medical practices available, unavoidable complications occasionally occur. If you have any problems or questions after discharge, call Dr. Shaaron at [phone]. ACTIVITY You may resume your regular activity, but move at a slower pace for the next 24 hours.  Take frequent rest periods for the next 24 hours.  Walking will help get rid of the air and reduce the bloated feeling in your belly (abdomen).  No driving for 24 hours (because of the medicine (anesthesia) used during the test).   Do not sign any important legal documents or operate any machinery for 24 hours (because of the anesthesia used during the test).  NUTRITION Drink plenty of fluids.  You may resume your normal diet as instructed by your doctor.  Begin with a light meal and progress to your normal diet. Heavy or fried foods are harder to digest and may make you feel sick to your stomach (nauseated).  Avoid alcoholic beverages for 24 hours or as instructed.  MEDICATIONS You may resume your normal medications unless your doctor tells you otherwise.  WHAT YOU CAN EXPECT TODAY Some feelings of bloating in the abdomen.  Passage of more gas than usual.  Spotting of blood in your stool or on the toilet paper.  IF YOU HAD POLYPS REMOVED DURING THE COLONOSCOPY: No aspirin products for 7 days or as instructed.  No alcohol for 7 days or as instructed.  Eat a soft diet for the next 24 hours.  FINDING OUT THE RESULTS OF YOUR TEST Not all test results are available during your visit. If your test results are not back during the visit, make an appointment with your caregiver to find out the  results. Do not assume everything is normal if you have not heard from your caregiver or the medical facility. It is important for you to follow up on all of your test results.  SEEK IMMEDIATE MEDICAL ATTENTION IF: You have more than a spotting of blood in your stool.  Your belly is swollen (abdominal distention).  You are nauseated or vomiting.  You have a temperature over 101.  You have abdominal pain or discomfort that is severe or gets worse throughout the day.      ulcerative colitis appears to be in remission.   Multiple biopsies taken throughout your colon  further recommendations to follow pending review of pathology report   no change in her medication regimen

## 2023-12-04 NOTE — Anesthesia Preprocedure Evaluation (Signed)
Anesthesia Evaluation  Patient identified by MRN, date of birth, ID band Patient awake    Reviewed: Allergy & Precautions, H&P , NPO status , Patient's Chart, lab work & pertinent test results  Airway Mallampati: II  TM Distance: >3 FB Neck ROM: Full    Dental  (+) Dental Advisory Given, Missing   Pulmonary former smoker   Pulmonary exam normal breath sounds clear to auscultation       Cardiovascular negative cardio ROS Normal cardiovascular exam Rhythm:Regular Rate:Normal     Neuro/Psych negative neurological ROS  negative psych ROS   GI/Hepatic Neg liver ROS, PUD, Bowel prep,,,Ulcerative proctocolitis   Endo/Other  negative endocrine ROS    Renal/GU negative Renal ROS  negative genitourinary   Musculoskeletal negative musculoskeletal ROS (+)    Abdominal   Peds negative pediatric ROS (+)  Hematology negative hematology ROS (+)   Anesthesia Other Findings   Reproductive/Obstetrics negative OB ROS                             Anesthesia Physical Anesthesia Plan  ASA: 2  Anesthesia Plan: General   Post-op Pain Management: Minimal or no pain anticipated   Induction: Intravenous  PONV Risk Score and Plan: 1 and Propofol infusion  Airway Management Planned: Nasal Cannula and Natural Airway  Additional Equipment:   Intra-op Plan:   Post-operative Plan:   Informed Consent: I have reviewed the patients History and Physical, chart, labs and discussed the procedure including the risks, benefits and alternatives for the proposed anesthesia with the patient or authorized representative who has indicated his/her understanding and acceptance.     Dental advisory given  Plan Discussed with: CRNA and Surgeon  Anesthesia Plan Comments:        Anesthesia Quick Evaluation

## 2023-12-04 NOTE — Transfer of Care (Signed)
 Immediate Anesthesia Transfer of Care Note  Patient: Kyle Sexton  Procedure(s) Performed: COLONOSCOPY  Patient Location: PACU and Short Stay  Anesthesia Type:General  Level of Consciousness: awake, alert , and oriented  Airway & Oxygen Therapy: Patient Spontanous Breathing  Post-op Assessment: Report given to RN and Post -op Vital signs reviewed and stable  Post vital signs: Reviewed and stable  Last Vitals:  Vitals Value Taken Time  BP 97/63 12/04/23 08:40  Temp 36.6 C 12/04/23 08:37  Pulse 67 12/04/23 08:37  Resp 17 12/04/23 08:37  SpO2 96 % 12/04/23 08:40    Last Pain:  Vitals:   12/04/23 0837  TempSrc: Axillary  PainSc: 0-No pain      Patients Stated Pain Goal: 3 (12/04/23 0700)  Complications: No notable events documented.

## 2023-12-04 NOTE — H&P (Signed)
 @LOGO @   Primary Care Physician:  Gladis Mustard, FNP Primary Gastroenterologist:  Dr. Shaaron  Pre-Procedure History & Physical: HPI:  Kyle Sexton is a 68 y.o. male here for surveillance of longstanding left-sided proctocolitis and history of adenomas via colonoscopy.  Past Medical History:  Diagnosis Date   Basal cell carcinoma 08/29/2022   mid tip of nose - Mohs 09/30/2022   Basal cell carcinoma 08/29/2022   Mid back - Mohs 11/11/2022   Hyperlipidemia    Ulcerative proctocolitis without complication [facility])     Past Surgical History:  Procedure Laterality Date   ARGON LASER APPLICATION  11/26/2020   Procedure: ARGON LASER APPLICATION;  Surgeon: Shaaron Lamar HERO, MD;  [facility];  Service: Endoscopy;;   BIOPSY  11/26/2020   Procedure: BIOPSY;  Surgeon: Shaaron Lamar HERO, MD;  [facility];  Service: Endoscopy;;   BIOPSY  03/28/2022   Procedure: BIOPSY;  Surgeon: Shaaron Lamar HERO, MD;  [facility];  Service: Endoscopy;;   CATARACT EXTRACTION Bilateral    COLONOSCOPY  05/23/2005   Dr. Shaaron: diminutive rectal polyp and 2X2 cm complex, carpet-like polyp, removed via piecemeal fashion, with remaining tissue ablated with APC. fragments of tubular adenoma   COLONOSCOPY N/A 11/06/2012   MFM:Ozqu sided proctocolitis. Colonic diverticulosis   COLONOSCOPY WITH PROPOFOL  N/A 11/26/2020   diffusely abnormal mild changes of IBD/UC s/p segmental biopsy. Multiple clusters of hyperplastic appearing polyps throughout colon most likely representing pseudopolyps s/p biopsy. One 4X5 carpet polyp in cecum at IC valve. Second 1 cm polyp removed via piecemeal fashion. Path with quiescent colitis. Tubular adenomas. Overdue for 6 month follow-up.   COLONOSCOPY WITH PROPOFOL  N/A 03/28/2022   Procedure: COLONOSCOPY WITH PROPOFOL ;  Surgeon: Shaaron Lamar HERO, MD;  [facility];  Service: Endoscopy;  Laterality: N/A;  10:45 AM   KNEE SURGERY     both    POLYPECTOMY  11/26/2020   Procedure: POLYPECTOMY;  Surgeon: Shaaron Lamar HERO, MD;  [facility];  Service: Endoscopy;;   POLYPECTOMY  03/28/2022   Procedure: POLYPECTOMY;  Surgeon: Shaaron Lamar HERO, MD;  [facility];  Service: Endoscopy;;    Prior to Admission medications   Medication Sig Start Date End Date Taking? Authorizing Provider  celecoxib  (CELEBREX ) 200 MG capsule TAKE 1 CAPSULE BY MOUTH TWICE A DAY 11/27/23  Yes Gladis, Mary-Margaret, FNP  mesalamine  (APRISO ) 0.375 g 24 hr capsule TAKE 4 CAPSULES (1.5 G TOTAL) BY MOUTH DAILY IN THE MORNING 05/15/23  Yes Shirlean Therisa ORN, NP  sildenafil  (REVATIO ) 20 MG tablet TAKE ONE (1) TABLET BY MOUTH 3 TIMES DAILY 10/23/23  Yes Gladis, Mary-Margaret, FNP  EPINEPHrine  0.3 mg/0.3 mL IJ SOAJ injection Inject 0.3 mg into the muscle as needed for anaphylaxis. 10/06/23   Gladis Mustard, FNP    Allergies as of 10/25/2023 - Review Complete 10/13/2023  Allergen Reaction Noted   Bee venom Anaphylaxis 10/30/2012    Family History  Problem Relation Age of Onset   Diabetes Mother    Crohn's disease Son    Colon cancer Neg Hx     Social History   Socioeconomic History   Marital status: Married    Spouse name: Not on file   Number of children: 2   Years of education: Not on file   Highest education level: Associate degree: occupational, Scientist, product/process development, or vocational program  Occupational History   Not on file  Tobacco Use   Smoking status: Former   Smokeless tobacco:  Current    Types: Chew  Vaping Use   Vaping status: Never Used  Substance and Sexual Activity   Alcohol use: Yes    Comment: 2 beers on occasion   Drug use: No   Sexual activity: Not on file  Other Topics Concern   Not on file  Social History Narrative   Not on file   Social Drivers of Health   Financial Resource Strain: Not on file  Food Insecurity: Not on file  Transportation Needs: Not on file  Physical Activity: Not on file  Stress: Not on file   Social Connections: Not on file  Intimate Partner Violence: Not on file    Review of Systems: See HPI, otherwise negative ROS  Physical Exam: BP 118/80   Pulse (!) 57   Temp 98.2 F (36.8 C) (Oral)   Resp 17   Ht 6' 4 (1.93 m)   Wt 94.3 kg   SpO2 96%   BMI 25.32 kg/m  General:   Alert,  Well-developed, well-nourished, pleasant and cooperative in NAD Neck:  Supple; no masses or thyromegaly. No significant cervical adenopathy. Lungs:  Clear throughout to auscultation.   No wheezes, crackles, or rhonchi. No acute distress. Heart:  Regular rate and rhythm; no murmurs, clicks, rubs,  or gallops. Abdomen: Non-distended, normal bowel sounds.  Soft and nontender without appreciable mass or hepatosplenomegaly.   Impression/Plan: 68 year old gentleman history of colonic adenomas longstanding left-sided proctocolitis, pseudo HERE for surveillance examination. The risks, benefits, limitations, alternatives and imponderables have been reviewed with the patient. Questions have been answered. All parties are agreeable.       Notice: This dictation was prepared with Dragon dictation along with smaller phrase technology. Any transcriptional errors that result from this process are unintentional and may not be corrected upon review.

## 2023-12-04 NOTE — Op Note (Signed)
 [facility] Patient Name: Kyle Sexton Procedure Date: 12/04/2023 7:50 AM MRN: [id number] Date of Birth: [birth year] Attending MD: Lamar Ozell Hollingshead , MD, [id number] CSN: [id number] Age: 68 Admit Type: Outpatient Procedure:                Colonoscopy Indications:              High risk colon cancer surveillance: Personal                            history of colonic polyps, High risk colon cancer                            surveillance: Ulcerative left sided colitis of 8                            (or more) years duration Providers:                Lamar Ozell Hollingshead, MD, Leandrew Edelman RN, RN,                            Daphne Mulch Technician, Technician Referring MD:              Medicines:                Propofol  per Anesthesia Complications:            No immediate complications. Estimated Blood Loss:     Estimated blood loss was minimal. Procedure:                Pre-Anesthesia Assessment:                           - Prior to the procedure, a History and Physical                            was performed, and patient medications and                            allergies were reviewed. The patient's tolerance of                            previous anesthesia was also reviewed. The risks                            and benefits of the procedure and the sedation                            options and risks were discussed with the patient.                            All questions were answered, and informed consent                            was obtained. Prior Anticoagulants: The patient has  taken no anticoagulant or antiplatelet agents. ASA                            Grade Assessment: II - A patient with mild systemic                            disease. After reviewing the risks and benefits,                            the patient was deemed in satisfactory condition to                            undergo the procedure.                           After  obtaining informed consent, the colonoscope                            was passed under direct vision. Throughout the                            procedure, the patient's blood pressure, pulse, and                            oxygen saturations were monitored continuously. The                            [id number]) scope was introduced through the                            anus and advanced to the the cecum, identified by                            appendiceal orifice and ileocecal valve. The                            colonoscopy was performed without difficulty. The                            patient tolerated the procedure well. The quality                            of the bowel preparation was adequate. The                            ileocecal valve, appendiceal orifice, and rectum                            were photographed. Scope In: 8:17:50 AM Scope Out: 8:33:12 AM Scope Withdrawal Time: 0 hours 9 minutes 13 seconds  Total Procedure Duration: 0 hours 15 minutes 22 seconds  Findings:      A few small-mouthed diverticula were found in the entire colon. Aside       from scattered pseudopolyps from the splenic flexure into  the ascending       segment, the colonic mucosa appeared entirely normal. No evidence of       inflammation no adenomatous appearing lesions found.      Segmental biopsies of the ascending transverse descending sigmoid and       rectal mucosa taken for histologic study. Grade 2 internal hemorrhoids       present.      The retroflexed view of the distal rectum and anal verge was normal and       showed no anal or rectal abnormalities. Impression:               - Diverticulosis in the entire examined colon.                            Scattered pseudopolyps. Otherwise, normal-appearing                            colonic mucosa. Status post segmental biopsy.                           - The distal rectum and anal verge are normal on                             retroflexion view.                           - Grade 2 internal hemorrhoids. Moderate Sedation:      Moderate (conscious) sedation was personally administered by an       anesthesia professional. The following parameters were monitored: oxygen       saturation, heart rate, blood pressure, respiratory rate, EKG, adequacy       of pulmonary ventilation, and response to care. Recommendation:           - Patient has a contact number available for                            emergencies. The signs and symptoms of potential                            delayed complications were discussed with the                            patient. Return to normal activities tomorrow.                            Written discharge instructions were provided to the                            patient.                           - Advance diet as tolerated. Follow-up on                            pathology. Further recommendations to follow. Procedure Code(s):        --- Professional ---  54621, Colonoscopy, flexible; diagnostic, including                            collection of specimen(s) by brushing or washing,                            when performed (separate procedure) Diagnosis Code(s):        --- Professional ---                           Z86.010, Personal history of colonic polyps                           K51.50, Left sided colitis without complications                           K57.30, Diverticulosis of large intestine without                            perforation or abscess without bleeding CPT copyright 2022 American [employer] Association. All rights reserved. The codes documented in this report are preliminary and upon coder review may  be revised to meet current compliance requirements. Lamar HERO. Linetta Regner, MD Lamar Ozell Hollingshead, MD 12/04/2023 8:50:47 AM This report has been signed electronically. Number of Addenda: 0

## 2023-12-05 ENCOUNTER — Encounter (HOSPITAL_COMMUNITY): Payer: Self-pay | Admitting: Internal Medicine

## 2023-12-05 LAB — SURGICAL PATHOLOGY

## 2023-12-07 ENCOUNTER — Ambulatory Visit: Payer: Self-pay | Admitting: Internal Medicine

## 2023-12-11 NOTE — Telephone Encounter (Signed)
 Pt was made aware and verbalized understanding.   Mandy: nic repeat colonoscopy and ov.

## 2023-12-12 NOTE — Progress Notes (Signed)
 Done

## 2024-01-31 ENCOUNTER — Other Ambulatory Visit: Payer: Self-pay | Admitting: Nurse Practitioner

## 2024-02-15 ENCOUNTER — Ambulatory Visit: Admitting: Nurse Practitioner

## 2024-02-28 ENCOUNTER — Other Ambulatory Visit: Payer: Self-pay | Admitting: Nurse Practitioner

## 2024-02-28 DIAGNOSIS — M19049 Primary osteoarthritis, unspecified hand: Secondary | ICD-10-CM

## 2024-03-15 ENCOUNTER — Ambulatory Visit: Admitting: Nurse Practitioner

## 2024-03-22 ENCOUNTER — Encounter: Payer: Self-pay | Admitting: Nurse Practitioner

## 2024-03-22 ENCOUNTER — Ambulatory Visit (INDEPENDENT_AMBULATORY_CARE_PROVIDER_SITE_OTHER): Admitting: Nurse Practitioner

## 2024-03-22 VITALS — BP 121/77 | HR 71 | Temp 97.7°F | Ht 76.0 in | Wt 217.0 lb

## 2024-03-22 DIAGNOSIS — K5180 Other ulcerative colitis without complications: Secondary | ICD-10-CM

## 2024-03-22 DIAGNOSIS — E782 Mixed hyperlipidemia: Secondary | ICD-10-CM

## 2024-03-22 DIAGNOSIS — N400 Enlarged prostate without lower urinary tract symptoms: Secondary | ICD-10-CM | POA: Diagnosis not present

## 2024-03-22 LAB — LIPID PANEL

## 2024-03-22 MED ORDER — SILDENAFIL CITRATE 20 MG PO TABS: 20.0000 mg | ORAL_TABLET | Freq: Every day | ORAL | 1 refills | Status: AC | PRN

## 2024-03-22 NOTE — Progress Notes (Signed)
 Subjective:    Patient ID: Kyle Sexton, male    DOB: 06-26-1955, 68 y.o.   MRN: [id number]   Chief Complaint: annual exam     HPI:  Kyle Sexton is a 68 y.o. who identifies as a male who was assigned male at birth.   Social history: Lives with: hisself Work history: chief operating officer   Comes in today for follow up of the following chronic medical issues:  1. Mixed hyperlipidemia Does watch diet and stays very active Lab Results  Component Value Date   CHOL 169 08/14/2023   HDL 59 08/14/2023   LDLCALC 93 08/14/2023   TRIG 93 08/14/2023   CHOLHDL 2.9 08/14/2023     2. Benign prostatic hyperplasia without lower urinary tract symptoms Denies any problems voiding  3. Ulcerative colitis Has had no recent issues  New complaints: None today  Allergies  Allergen Reactions   Bee Venom Anaphylaxis   Outpatient Encounter Medications as of 03/22/2024  Medication Sig   celecoxib  (CELEBREX ) 200 MG capsule TAKE 1 CAPSULE BY MOUTH TWICE A DAY   EPINEPHrine  0.3 mg/0.3 mL IJ SOAJ injection Inject 0.3 mg into the muscle as needed for anaphylaxis.   mesalamine  (APRISO ) 0.375 g 24 hr capsule TAKE 4 CAPSULES (1.5 G TOTAL) BY MOUTH DAILY IN THE MORNING   sildenafil  (REVATIO ) 20 MG tablet TAKE ONE (1) TABLET BY MOUTH 3 TIMES DAILY   No facility-administered encounter medications on file as of 03/22/2024.    Past Surgical History:  Procedure Laterality Date   ARGON LASER APPLICATION  11/26/2020   Procedure: ARGON LASER APPLICATION;  Surgeon: Shaaron Lamar HERO, MD;  [facility];  Service: Endoscopy;;   BIOPSY  11/26/2020   Procedure: BIOPSY;  Surgeon: Shaaron Lamar HERO, MD;  [facility];  Service: Endoscopy;;   BIOPSY  03/28/2022   Procedure: BIOPSY;  Surgeon: Shaaron Lamar HERO, MD;  [facility];  Service: Endoscopy;;   CATARACT EXTRACTION Bilateral    COLONOSCOPY  05/23/2005   Dr. Shaaron: diminutive rectal polyp and 2X2 cm complex, carpet-like  polyp, removed via piecemeal fashion, with remaining tissue ablated with APC. fragments of tubular adenoma   COLONOSCOPY N/A 11/06/2012   MFM:Ozqu sided proctocolitis. Colonic diverticulosis   COLONOSCOPY N/A 12/04/2023   Procedure: COLONOSCOPY;  Surgeon: Shaaron Lamar HERO, MD;  [facility];  Service: Endoscopy;  Laterality: N/A;  815am, asa 2   COLONOSCOPY WITH PROPOFOL  N/A 11/26/2020   diffusely abnormal mild changes of IBD/UC s/p segmental biopsy. Multiple clusters of hyperplastic appearing polyps throughout colon most likely representing pseudopolyps s/p biopsy. One 4X5 carpet polyp in cecum at IC valve. Second 1 cm polyp removed via piecemeal fashion. Path with quiescent colitis. Tubular adenomas. Overdue for 6 month follow-up.   COLONOSCOPY WITH PROPOFOL  N/A 03/28/2022   Procedure: COLONOSCOPY WITH PROPOFOL ;  Surgeon: Shaaron Lamar HERO, MD;  [facility];  Service: Endoscopy;  Laterality: N/A;  10:45 AM   KNEE SURGERY     both   POLYPECTOMY  11/26/2020   Procedure: POLYPECTOMY;  Surgeon: Shaaron Lamar HERO, MD;  [facility];  Service: Endoscopy;;   POLYPECTOMY  03/28/2022   Procedure: POLYPECTOMY;  Surgeon: Shaaron Lamar HERO, MD;  [facility];  Service: Endoscopy;;    Family History  Problem Relation Age of Onset   Diabetes Mother    Crohn's disease Son    Colon cancer Neg Hx       Controlled  substance contract: n/a     Review of Systems  Constitutional:  Negative for diaphoresis.  Eyes:  Negative for pain.  Respiratory:  Negative for shortness of breath.   Cardiovascular:  Negative for chest pain, palpitations and leg swelling.  Gastrointestinal:  Negative for abdominal pain.  Endocrine: Negative for polydipsia.  Skin:  Negative for rash.  Neurological:  Negative for dizziness, weakness and headaches.  Hematological:  Does not bruise/bleed easily.  All other systems reviewed and are negative.      Objective:   Physical  Exam Vitals and nursing note reviewed.  Constitutional:      Appearance: Normal appearance. He is well-developed.  HENT:     Head: Normocephalic.     Nose: Nose normal.     Mouth/Throat:     Mouth: Mucous membranes are moist.     Pharynx: Oropharynx is clear.  Eyes:     Pupils: Pupils are equal, round, and reactive to light.  Neck:     Thyroid : No thyroid  mass or thyromegaly.     Vascular: No carotid bruit or JVD.     Trachea: Phonation normal.  Cardiovascular:     Rate and Rhythm: Normal rate and regular rhythm.  Pulmonary:     Effort: Pulmonary effort is normal. No respiratory distress.     Breath sounds: Normal breath sounds.  Abdominal:     General: Bowel sounds are normal.     Palpations: Abdomen is soft.     Tenderness: There is no abdominal tenderness.     Hernia: A hernia (ventral hernia) is present.  Musculoskeletal:        General: Normal range of motion.     Cervical back: Normal range of motion and neck supple.  Lymphadenopathy:     Cervical: No cervical adenopathy.  Skin:    General: Skin is warm and dry.     Comments: Ft great toe nail is blackish green and about to fall off.  Neurological:     Mental Status: He is alert and oriented to person, place, and time.  Psychiatric:        Behavior: Behavior normal.        Thought Content: Thought content normal.        Judgment: Judgment normal.     BP 121/77   Pulse 71   Temp 97.7 F (36.5 C) (Temporal)   Ht 6' 4 (1.93 m)   Wt 217 lb (98.4 kg)   BMI 26.41 kg/m         Assessment & Plan:   Kyle Sexton comes in today with chief complaint of medical management of chronic issues    Diagnosis and orders addressed:  1. Mixed hyperlipidemia Low fat diet  2. Benign prostatic hyperplasia without lower urinary tract symptoms Report any problems voiding  3. Ulcerative colitis Watch diet to prevent flare up    Labs pending Health Maintenance reviewed Diet and exercise encouraged  Follow up  plan: 6 months   Mary-Margaret Gladis, FNP

## 2024-03-23 LAB — CBC WITH DIFFERENTIAL/PLATELET
Basophils Absolute: 0.1 x10E3/uL (ref 0.0–0.2)
Basos: 1 %
EOS (ABSOLUTE): 0.2 x10E3/uL (ref 0.0–0.4)
Eos: 3 %
Hematocrit: 46.2 % (ref 37.5–51.0)
Hemoglobin: 15.5 g/dL (ref 13.0–17.7)
Immature Grans (Abs): 0 x10E3/uL (ref 0.0–0.1)
Immature Granulocytes: 0 %
Lymphocytes Absolute: 2.2 x10E3/uL (ref 0.7–3.1)
Lymphs: 29 %
MCH: 31.8 pg (ref 26.6–33.0)
MCHC: 33.5 g/dL (ref 31.5–35.7)
MCV: 95 fL (ref 79–97)
Monocytes Absolute: 0.8 x10E3/uL (ref 0.1–0.9)
Monocytes: 10 %
Neutrophils Absolute: 4.3 x10E3/uL (ref 1.4–7.0)
Neutrophils: 57 %
Platelets: 212 x10E3/uL (ref 150–450)
RBC: 4.87 x10E6/uL (ref 4.14–5.80)
RDW: 12.7 % (ref 11.6–15.4)
WBC: 7.4 x10E3/uL (ref 3.4–10.8)

## 2024-03-23 LAB — CMP14+EGFR
ALT: 22 IU/L (ref 0–44)
AST: 22 IU/L (ref 0–40)
Albumin: 4.3 g/dL (ref 3.9–4.9)
Alkaline Phosphatase: 64 IU/L (ref 47–123)
BUN/Creatinine Ratio: 22 (ref 10–24)
BUN: 19 mg/dL (ref 8–27)
Bilirubin Total: 0.4 mg/dL (ref 0.0–1.2)
CO2: 22 mmol/L (ref 20–29)
Calcium: 9 mg/dL (ref 8.6–10.2)
Chloride: 104 mmol/L (ref 96–106)
Creatinine, Ser: 0.85 mg/dL (ref 0.76–1.27)
Globulin, Total: 1.9 g/dL (ref 1.5–4.5)
Glucose: 96 mg/dL (ref 70–99)
Potassium: 3.9 mmol/L (ref 3.5–5.2)
Sodium: 140 mmol/L (ref 134–144)
Total Protein: 6.2 g/dL (ref 6.0–8.5)
eGFR: 94 mL/min/1.73 (ref >59)

## 2024-03-23 LAB — PSA, TOTAL AND FREE
PSA, Free Pct: 27.5 %
PSA, Free: 0.44 ng/mL
Prostate Specific Ag, Serum: 1.6 ng/mL (ref 0.0–4.0)

## 2024-03-23 LAB — LIPID PANEL
Cholesterol, Total: 171 mg/dL (ref 100–199)
HDL: 53 mg/dL (ref >39)
LDL CALC COMMENT:: 3.2 ratio (ref 0.0–5.0)
LDL Chol Calc (NIH): 95 mg/dL (ref 0–99)
Triglycerides: 129 mg/dL (ref 0–149)
VLDL Cholesterol Cal: 23 mg/dL (ref 5–40)

## 2024-03-25 ENCOUNTER — Ambulatory Visit: Payer: Self-pay | Admitting: Nurse Practitioner

## 2024-03-30 ENCOUNTER — Other Ambulatory Visit: Payer: Self-pay | Admitting: Nurse Practitioner

## 2024-03-30 DIAGNOSIS — M19049 Primary osteoarthritis, unspecified hand: Secondary | ICD-10-CM

## 2024-05-28 ENCOUNTER — Other Ambulatory Visit: Payer: Self-pay | Admitting: Gastroenterology

## 2024-06-03 ENCOUNTER — Other Ambulatory Visit: Payer: Self-pay

## 2024-06-03 ENCOUNTER — Telehealth: Payer: Self-pay

## 2024-06-03 MED ORDER — MESALAMINE ER 0.375 G PO CP24: 1.5000 g | ORAL_CAPSULE | Freq: Every day | ORAL | 2 refills | Status: AC

## 2024-06-03 NOTE — Telephone Encounter (Signed)
 Pt is needing refills on mesalamine  to get him to an appt for follow up.

## 2024-06-03 NOTE — Telephone Encounter (Signed)
 Rx sent to [facility] as requested.   Ladonna: please arrange f/u for future refills.

## 2024-07-16 ENCOUNTER — Ambulatory Visit: Admitting: Internal Medicine

## 2024-09-19 ENCOUNTER — Ambulatory Visit: Admitting: Nurse Practitioner
